# Patient Record
Sex: Female | Born: 1955 | Race: White | Hispanic: No | Marital: Married | Smoking: Never smoker
Health system: Southern US, Community
[De-identification: ages and names within clinical notes are randomized; demographics above are authoritative.]

## PROBLEM LIST (undated history)

## (undated) DIAGNOSIS — E039 Hypothyroidism, unspecified: Secondary | ICD-10-CM

## (undated) DIAGNOSIS — C3490 Malignant neoplasm of unspecified part of unspecified bronchus or lung: Secondary | ICD-10-CM

## (undated) DIAGNOSIS — K219 Gastro-esophageal reflux disease without esophagitis: Secondary | ICD-10-CM

## (undated) HISTORY — DX: Gastro-esophageal reflux disease without esophagitis: K21.9

## (undated) HISTORY — DX: Malignant neoplasm of unspecified part of unspecified bronchus or lung: C34.90

## (undated) HISTORY — DX: Hypothyroidism, unspecified: E03.9

## (undated) HISTORY — PX: LOBECTOMY: SHX5089

---

## 1981-07-26 HISTORY — PX: BREAST LUMPECTOMY: SHX2

## 2010-07-26 HISTORY — PX: OTHER SURGICAL HISTORY: SHX169

## 2014-07-26 HISTORY — PX: CATARACT EXTRACTION: SUR2

## 2015-10-27 LAB — BASIC METABOLIC PANEL
BUN: 12 mg/dL (ref 4–21)
Creatinine: 1.2 mg/dL — AB (ref 0.5–1.1)
Glucose: 88 mg/dL
Potassium: 3.9 mmol/L (ref 3.4–5.3)
Sodium: 141 mmol/L (ref 137–147)

## 2015-10-27 LAB — LIPID PANEL
Cholesterol: 231 mg/dL — AB (ref 0–200)
HDL: 50 mg/dL (ref 35–70)
LDL Cholesterol: 159 mg/dL
Triglycerides: 109 mg/dL (ref 40–160)

## 2015-10-27 LAB — HEPATIC FUNCTION PANEL: ALT: 17 U/L (ref 7–35)

## 2015-10-27 LAB — CBC AND DIFFERENTIAL
HCT: 43 % (ref 36–46)
Hemoglobin: 14.5 g/dL (ref 12.0–16.0)
Platelets: 311 10*3/uL (ref 150–399)
WBC: 8.1 10^3/mL

## 2015-10-28 LAB — HM PAP SMEAR: HM Pap smear: NORMAL

## 2016-01-06 ENCOUNTER — Ambulatory Visit (INDEPENDENT_AMBULATORY_CARE_PROVIDER_SITE_OTHER): Payer: No Typology Code available for payment source | Admitting: Nurse Practitioner

## 2016-01-06 ENCOUNTER — Encounter: Payer: Self-pay | Admitting: Nurse Practitioner

## 2016-01-06 VITALS — BP 118/82 | HR 59 | Temp 98.2°F | Resp 17 | Ht 64.5 in | Wt 178.6 lb

## 2016-01-06 DIAGNOSIS — C3490 Malignant neoplasm of unspecified part of unspecified bronchus or lung: Secondary | ICD-10-CM | POA: Insufficient documentation

## 2016-01-06 DIAGNOSIS — E034 Atrophy of thyroid (acquired): Secondary | ICD-10-CM

## 2016-01-06 DIAGNOSIS — C3432 Malignant neoplasm of lower lobe, left bronchus or lung: Secondary | ICD-10-CM | POA: Diagnosis not present

## 2016-01-06 DIAGNOSIS — K219 Gastro-esophageal reflux disease without esophagitis: Secondary | ICD-10-CM

## 2016-01-06 DIAGNOSIS — E039 Hypothyroidism, unspecified: Secondary | ICD-10-CM | POA: Insufficient documentation

## 2016-01-06 DIAGNOSIS — G4700 Insomnia, unspecified: Secondary | ICD-10-CM | POA: Diagnosis not present

## 2016-01-06 DIAGNOSIS — E038 Other specified hypothyroidism: Secondary | ICD-10-CM

## 2016-01-06 NOTE — Progress Notes (Signed)
Patient ID: Tami Parks, female   DOB: 22-May-1956, 60 y.o.   MRN: [id number]    PCP: [name], NP  Advanced Directive information Does patient have an advance directive?: Yes, Type of Advance Directive: [place];Living will  Allergies  Allergen Reactions  . Sulfa Antibiotics Swelling  . Oxycodone Rash    Chief Complaint  Patient presents with  . Medical Management of Chronic Issues    Establish as new patient.      HPI: Patient is a 60 y.o. female seen in the office today to establish care. Pt and her husband moved from [place] were she previously had routine medical care.  Pt has lung cancer which is in remission. (previously given 6 months to live- this was in 2012, had chemo. Has "dots" in her lungs that no one can explain.not currently having symptoms. Was found after she had pneumonia. Had bronchoscopy and was cancerous, had lobectomy to remove tumor, then had chemo.  Needs to establish with oncologist in town.  Had hyperthyroid and was given radioactive treatment now hypothyroid  last mammogram in 2014- does not wish to have mammogram too frequently to limit exposure to xray Declines colonoscopy- does stool cards (have been negative) Walks frequently and stretching  Heart healthy diet  Last physical and blood work done April   Review of Systems:  Review of Systems  Constitutional: Negative for activity change, appetite change, fatigue and unexpected weight change.  HENT: Negative for congestion and hearing loss.   Eyes: Negative.        Wears corrective lens  Respiratory: Negative for cough and shortness of breath.   Cardiovascular: Negative for chest pain, palpitations and leg swelling.  Gastrointestinal: Negative for abdominal pain, diarrhea and constipation.  Genitourinary: Negative for dysuria and difficulty urinating.  Musculoskeletal: Negative for myalgias and arthralgias.  Skin: Positive for rash (on side of her nose off and on for  15 years). Negative for color change and wound.  Neurological: Negative for dizziness and weakness.  Psychiatric/Behavioral: Positive for sleep disturbance (better on medication). Negative for behavioral problems, confusion and agitation.    Past Medical History  Diagnosis Date  . Hypothyroid   . Lung cancer ([place])   . GERD (gastroesophageal reflux disease)    Past Surgical History  Procedure Laterality Date  . Cesarean section  1982  . Breast lumpectomy Left 1983  . Cesarean section  1984  . Lower left lobe removal  2012    Dr. Avelina Laine. [facility] Natter  . Cataract extraction Right 2016    Dr. Loma Boston  . Lobectomy Left left lower lobe    2012   Social History:   reports that she has never smoked. She does not have any smokeless tobacco history on file. She reports that she does not drink alcohol or use illicit drugs.  Family History  Problem Relation Age of Onset  . Diabetes Father   . High blood pressure Father   . Heart disease Father   . Hypertension Father   . Parkinson's disease Mother   . Asthma Mother   . Hyperthyroidism Sister   . Heart disease Son   . Asthma Son     Medications: Patient's Medications  New Prescriptions   No medications on file  Previous Medications   DIPHENHYDRAMINE (SOMINEX) 25 MG TABLET    Take 25 mg by mouth at bedtime as needed for sleep.   KETOCONAZOLE (NIZORAL) 2 % CREAM    Apply 1 application topically daily as needed for  irritation.   LEVOTHYROXINE (SYNTHROID, LEVOTHROID) 88 MCG TABLET       OMEPRAZOLE (PRILOSEC) 40 MG CAPSULE       VITAMIN C (ASCORBIC ACID) 500 MG TABLET    Take 500 mg by mouth daily as needed.  Modified Medications   No medications on file  Discontinued Medications   No medications on file     Physical Exam:  Filed Vitals:   01/06/16 0909  BP: 118/82  Pulse: 59  Temp: 98.2 F (36.8 C)  TempSrc: Oral  Resp: 17  Height: 5' 4.5" (1.638 m)  Weight: 178 lb 9.6 oz (81.012 kg)  SpO2: 96%   Body mass index is  30.19 kg/(m^2).  Physical Exam  Constitutional: She is oriented to person, place, and time. She appears well-developed and well-nourished. No distress.  HENT:  Head: Normocephalic and atraumatic.  Mouth/Throat: Oropharynx is clear and moist. No oropharyngeal exudate.  Eyes: Conjunctivae are normal. Pupils are equal, round, and reactive to light.  Neck: Normal range of motion. Neck supple.  Cardiovascular: Normal rate, regular rhythm and normal heart sounds.   Pulmonary/Chest: Effort normal and breath sounds normal.  Abdominal: Soft. Bowel sounds are normal.  Musculoskeletal: She exhibits no edema or tenderness.  Neurological: She is alert and oriented to person, place, and time.  Skin: Skin is warm and dry. She is not diaphoretic.  Psychiatric: She has a normal mood and affect.    Labs reviewed: Basic Metabolic Panel:  Recent Labs  10/27/15  NA 141  K 3.9  BUN 12  CREATININE 1.2*   Liver Function Tests:  Recent Labs  10/27/15  ALT 17   No results for input(s): LIPASE, AMYLASE in the last 8760 hours. No results for input(s): AMMONIA in the last 8760 hours. CBC:  Recent Labs  10/27/15  WBC 8.1  HGB 14.5  HCT 43  PLT 311   Lipid Panel:  Recent Labs  10/27/15  CHOL 231*  HDL 50  LDLCALC 159  TRIG 109   TSH: 06/27/15 TSH 2.94 No results for input(s): TSH in the last 8760 hours. A1C: No results found for: HGBA1C   Assessment/Plan 1. Hypothyroidism due to acquired atrophy of thyroid -has cont on synthroid 7mg, tsh stable  2. Gastroesophageal reflux disease without esophagitis -stable on prolosec 40 mg daily  3. Malignant neoplasm of lower lobe of left lung ([place] -s/p lobectomy in remission currently, plans to establish with oncologist in [place] - CBC with Differential/Platelets; Future - Comprehensive metabolic panel; Future  4. 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 Taking sominex with good effect  To follow up in April for physical with  fasting blood work prior to visit -to obtain TSH, CBC, CMP and fasting lipids  Jessica K. EHarle Battiest [facility] [phone] am - 5 pm) [phone](after hours)

## 2016-01-06 NOTE — Patient Instructions (Signed)
Follow up in April 2018 with lab work prior to visit

## 2016-03-18 ENCOUNTER — Ambulatory Visit: Payer: No Typology Code available for payment source

## 2016-03-18 DIAGNOSIS — Z23 Encounter for immunization: Secondary | ICD-10-CM

## 2016-06-08 ENCOUNTER — Other Ambulatory Visit: Payer: Self-pay

## 2016-06-08 ENCOUNTER — Telehealth: Payer: Self-pay | Admitting: *Deleted

## 2016-06-08 DIAGNOSIS — C3490 Malignant neoplasm of unspecified part of unspecified bronchus or lung: Secondary | ICD-10-CM

## 2016-06-08 MED ORDER — LEVOTHYROXINE SODIUM 88 MCG PO TABS: 88.0000 ug | ORAL_TABLET | Freq: Every day | ORAL | 1 refills | Status: DC

## 2016-06-08 NOTE — Telephone Encounter (Signed)
Okay to place referral to oncology

## 2016-06-08 NOTE — Telephone Encounter (Signed)
Received a fax from [facility] that requested a 90 day supply of levothyroxine 88 mcg. Prescription was sent electronically.

## 2016-06-08 NOTE — Telephone Encounter (Signed)
Patient called and Requested a Referral to [facility]. Needs to see Oncology for follow up for Lung Cancer that is in remission. Please Advise.

## 2016-06-09 NOTE — Telephone Encounter (Signed)
Referral placed and patient notified.

## 2016-06-30 ENCOUNTER — Encounter: Payer: Self-pay | Admitting: Internal Medicine

## 2016-06-30 ENCOUNTER — Other Ambulatory Visit (HOSPITAL_BASED_OUTPATIENT_CLINIC_OR_DEPARTMENT_OTHER): Payer: No Typology Code available for payment source

## 2016-06-30 ENCOUNTER — Other Ambulatory Visit: Payer: Self-pay | Admitting: *Deleted

## 2016-06-30 ENCOUNTER — Telehealth: Payer: Self-pay | Admitting: Internal Medicine

## 2016-06-30 ENCOUNTER — Ambulatory Visit (HOSPITAL_BASED_OUTPATIENT_CLINIC_OR_DEPARTMENT_OTHER): Payer: No Typology Code available for payment source | Admitting: Internal Medicine

## 2016-06-30 DIAGNOSIS — Z85118 Personal history of other malignant neoplasm of bronchus and lung: Secondary | ICD-10-CM | POA: Diagnosis not present

## 2016-06-30 DIAGNOSIS — R918 Other nonspecific abnormal finding of lung field: Secondary | ICD-10-CM | POA: Diagnosis not present

## 2016-06-30 DIAGNOSIS — C3410 Malignant neoplasm of upper lobe, unspecified bronchus or lung: Secondary | ICD-10-CM

## 2016-06-30 DIAGNOSIS — C3432 Malignant neoplasm of lower lobe, left bronchus or lung: Secondary | ICD-10-CM | POA: Insufficient documentation

## 2016-06-30 LAB — CBC WITH DIFFERENTIAL/PLATELET
BASO%: 0.6 % (ref 0.0–2.0)
Basophils Absolute: 0 10*3/uL (ref 0.0–0.1)
EOS%: 1.8 % (ref 0.0–7.0)
Eosinophils Absolute: 0.1 10*3/uL (ref 0.0–0.5)
HCT: 39.7 % (ref 34.8–46.6)
HGB: 13.7 g/dL (ref 11.6–15.9)
LYMPH%: 30.9 % (ref 14.0–49.7)
MCH: 29.9 pg (ref 25.1–34.0)
MCHC: 34.5 g/dL (ref 31.5–36.0)
MCV: 86.7 fL (ref 79.5–101.0)
MONO#: 0.4 10*3/uL (ref 0.1–0.9)
MONO%: 6.2 % (ref 0.0–14.0)
NEUT#: 4.1 10*3/uL (ref 1.5–6.5)
NEUT%: 60.5 % (ref 38.4–76.8)
Platelets: 302 10*3/uL (ref 145–400)
RBC: 4.58 10*6/uL (ref 3.70–5.45)
RDW: 13.3 % (ref 11.2–14.5)
WBC: 6.8 10*3/uL (ref 3.9–10.3)
lymph#: 2.1 10*3/uL (ref 0.9–3.3)
nRBC: 0 % (ref 0–0)

## 2016-06-30 LAB — COMPREHENSIVE METABOLIC PANEL
ALT: 14 U/L (ref 0–55)
AST: 18 U/L (ref 5–34)
Albumin: 3.8 g/dL (ref 3.5–5.0)
Alkaline Phosphatase: 98 U/L (ref 40–150)
Anion Gap: 10 mEq/L (ref 3–11)
BUN: 13.4 mg/dL (ref 7.0–26.0)
CO2: 26 mEq/L (ref 22–29)
Calcium: 9.8 mg/dL (ref 8.4–10.4)
Chloride: 106 mEq/L (ref 98–109)
Creatinine: 0.9 mg/dL (ref 0.6–1.1)
EGFR: 66 mL/min/{1.73_m2} — ABNORMAL LOW (ref >90)
Glucose: 103 mg/dl (ref 70–140)
Potassium: 3.6 mEq/L (ref 3.5–5.1)
Sodium: 142 mEq/L (ref 136–145)
Total Bilirubin: 0.39 mg/dL (ref 0.20–1.20)
Total Protein: 7.5 g/dL (ref 6.4–8.3)

## 2016-06-30 MED ORDER — LORAZEPAM 0.5 MG PO TABS: ORAL_TABLET | ORAL | 0 refills | Status: DC

## 2016-06-30 NOTE — Telephone Encounter (Signed)
Appointments scheduled per 12/6 LOS. Patient given AVS report and calendars with future scheduled appointments. Patient aware of scan to be scheduled.

## 2016-06-30 NOTE — Progress Notes (Signed)
[place] Telephone:([phone]   Fax:([phone]  CONSULT NOTE  REFERRING PHYSICIAN: Sherrie Mustache, NP  REASON FOR CONSULTATION:  60 years old white female with history of lung cancer.  HPI Tami Parks is a 60 y.o. female a never smoker with past medical history significant for hypothyroidism status post radioactive iodine, GERD, left breast lumpectomy for benign lesion as well as history of stage II non-small cell lung cancer, adenocarcinoma diagnosed in 2012. The patient mentions that in December 2011 she was treated for pneumonia and follow-up imaging studies in 2012 showed persistent left lower lobe lung capacity. She underwent bronchoscopy at [facility] and it was consistent with adenocarcinoma. The patient underwent left lower lobectomy with lymph node dissection under the care of Dr. Marcelle Overlie at [facility] and the final pathology was consistent with a stage IIB (pT3, N0, MX) with tumor measuring  7.5 cm adenocarcinoma. Her tumor was negative for EGFR and ALK mutations. She received 4 cycles of adjuvant systemic chemotherapy with cisplatin based regimen. Unfortunately I'm not able to find the other agent of chemotherapy that was used with cisplatin from the [facility] records. After completion of her systemic chemotherapy and restaging scan showed development of new tiny nodules in the right upper lobe. These nodules continue to progress on several scans and also she developed nodules in the left lung. Several options were discussed at that time including surveillance versus biopsy versus treatment for atypical infection. The patient was seen by infectious disease physician at [facility] Dr. 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 She change in her oncologist to Dr. Kallie Edward. She was followed by observation and her last CT scan of the chest. Her last CT scan of the chest was performed on 02/07/2015 and it showed multifocal solid and ground  glass masses seen in the right upper and lower lobes that have increased in size in the interim. The conglomerated mass area in the right lower lobe measured approximately 6.9 x 4.2 cm in comparison to 6.1 x 3.5 cm on the previous study. The mass in the right upper lobe measured 4.7 x 9.2 cm compares to 3.9 x 7.7 cm on previous study. In the left lung there were multiple nodules seen predominantly in the basal aspect of the left upper lobe and some of these nodules were new compared to the previous study. The patient left to [facility] to receive her care at [facility]. She was a little bit concerned about her previous care at [facility]. She established care with Mrs. Dewaine Oats for primary care. She was kindly referred to me today for evaluation and recommendation regarding her condition. She has no imaging studies since July 2016. The patient is feeling fine today with no complaints. She denied having any significant weight loss or night sweats. She has no chest pain, shortness breath, cough or hemoptysis. She denied having any nausea or vomiting, no fever or chills. She denied having any headache or visual changes. Family history significant for father with diabetes mellitus, mother with hypothyroidism and parkinsonism. The patient is married and has 2 children. She was accompanied today by her husband Tami Parks. Her children live in [place]. 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 She has no history of smoking, alcohol or drug abuse.  HPI  Past Medical History:  Diagnosis Date  . GERD (gastroesophageal reflux disease)   . Hypothyroid   . Lung cancer [facility])     Past Surgical  History:  Procedure Laterality Date  . BREAST LUMPECTOMY Left 1983  . CATARACT EXTRACTION Right 2016   Dr. Loma Boston  . [place]  . CESAREAN SECTION  1984  . LOBECTOMY Left left lower lobe   2012  . lower left lobe removal  2012   Dr. Avelina Laine. Williemae Natter    Family  History  Problem Relation Age of Onset  . Diabetes Father   . High blood pressure Father   . Heart disease Father   . Hypertension Father   . Parkinson's disease Mother   . Asthma Mother   . Hyperthyroidism Sister   . Heart disease Son   . Asthma Son     Social History Social History  Substance Use Topics  . Smoking status: Never Smoker  . Smokeless tobacco: Never Used  . Alcohol use No    Allergies  Allergen Reactions  . Sulfa Antibiotics Swelling  . Oxycodone Rash    Current Outpatient Prescriptions  Medication Sig Dispense Refill  . diphenhydrAMINE (SOMINEX) 25 MG tablet Take 25 mg by mouth at bedtime as needed for sleep.    Marland Kitchen DIPHENHYDRAMINE HCL PO Take by mouth.    Marland Kitchen ketoconazole (NIZORAL) 2 % cream Apply 1 application topically daily as needed for irritation.    Marland Kitchen levothyroxine (SYNTHROID, LEVOTHROID) 88 MCG tablet Take 1 tablet (88 mcg total) by mouth daily before breakfast. 98 tablet 1  . omeprazole (PRILOSEC) 40 MG capsule     . vitamin C (ASCORBIC ACID) 500 MG tablet Take 500 mg by mouth daily as needed.    Marland Kitchen LORazepam (ATIVAN) 0.5 MG tablet Take 1 or 2 tablets before the CT scan. 4 tablet 0   No current facility-administered medications for this visit.    Blood pressure 120/77, pulse (!) 57, temperature 98.2 F (36.8 C), temperature source Oral, resp. rate 18, height 5' 4.5" (1.638 m), weight 176 lb 4.8 oz (80 kg), SpO2 99 %. Review of Systems  Constitutional: negative Eyes: negative for icterus, irritation and redness Ears, nose, mouth, throat, and face: negative for epistaxis, hoarseness, sore mouth and sore throat Respiratory: negative Cardiovascular: negative for dyspnea, fatigue, orthopnea and palpitations Gastrointestinal: negative for constipation, diarrhea, nausea and vomiting Genitourinary:negative for dysuria, frequency and hematuria Integument/breast: negative for dryness and rash Hematologic/lymphatic: negative for bleeding and easy  bruising Musculoskeletal:negative for arthralgias, back pain, bone pain and muscle weakness Neurological: negative for dizziness, gait problems, headaches, tremors and weakness Behavioral/Psych: negative Endocrine: negative Allergic/Immunologic: negative  Physical Exam  GGE:ZMOQH, healthy, no distress, well nourished, well developed and anxious SKIN: skin color, texture, turgor are normal, no rashes or significant lesions HEAD: Normocephalic, No masses, lesions, tenderness or abnormalities EYES: normal, PERRLA, Conjunctiva are pink and non-injected EARS: External ears normal, Canals clear OROPHARYNX:no exudate, no erythema and lips, buccal mucosa, and tongue normal  NECK: supple, no adenopathy, no JVD LYMPH:  no palpable lymphadenopathy, no hepatosplenomegaly BREAST:not examined LUNGS: clear to auscultation , and palpation HEART: regular rate & rhythm, no murmurs and no gallops ABDOMEN:abdomen soft, non-tender, normal bowel sounds and no masses or organomegaly BACK: Back symmetric, no curvature., No CVA tenderness EXTREMITIES:no joint deformities, effusion, or inflammation, no edema, no skin discoloration  NEURO: alert & oriented x 3 with fluent speech, no focal motor/sensory deficits  PERFORMANCE STATUS: ECOG 0  LABORATORY DATA: Lab Results  Component Value Date   WBC 6.8 06/30/2016   HGB 13.7 06/30/2016   HCT 39.7 06/30/2016   MCV 86.7 06/30/2016   PLT  302 06/30/2016      Chemistry      Component Value Date/Time   NA 142 06/30/2016 1300   K 3.6 06/30/2016 1300   CO2 26 06/30/2016 1300   BUN 13.4 06/30/2016 1300   CREATININE 0.9 06/30/2016 1300   GLU 88 10/27/2015      Component Value Date/Time   CALCIUM 9.8 06/30/2016 1300   ALKPHOS 98 06/30/2016 1300   AST 18 06/30/2016 1300   ALT 14 06/30/2016 1300   BILITOT 0.39 06/30/2016 1300       RADIOGRAPHIC STUDIES: No results found.  ASSESSMENT:This is a very pleasant 60 years old white female never smoker with  history of stage IIB (T3, N0, M0) non-small cell lung cancer, adenocarcinoma with negative EGFR or ALK mutations. She status post left lower lobectomy with lymph node dissection in May 2012 under the care of Dr. Gwenlyn Found at [facility]. This was followed by adjuvant systemic chemotherapy with 4 cycles of cisplatin-based regimen. Over the last few years since October 2012 the patient has progressive solid and groundglass opacities mainly in the right upper and lower lobe but also in the left upper lobe highly concerning for disease recurrence. She has been observation or that time with no additional treatment. She did not have any biopsy for confirmation of disease recurrence. She was treated in the past for questionable atypical infection. She is feeling fine today. Her last imaging studies were performed in July 2016.  PLAN: I had a lengthy discussion with the patient and her husband today about her current condition and further investigation regarding her condition. I strongly recommended for the patient to consider repeating CT scan of the chest for restaging of her disease. She is anxious and she mentions that she may not be able to have the scan done because of her anxiety and claustrophobia. She refused the scan to be performed with contrast and agreed to CT scan of the chest without contrast. She is also not interested in having a PET scan done. I will arrange for the patient to have CT scan of the chest without contrast next week. I will see her back for follow-up visit in 2 weeks for reevaluation and discussion of her scan results and further recommendation regarding her condition. I'm not sure if the patient would be interested in any future treatment if needed including chemotherapy or immunotherapy. This will be discussed with her more details based on the scan results. For the anxiety and claustrophobia, I gave the patient prescription for Ativan 0.5 mg to be used 1-2 tablets before her  scan. She was advised to call immediately if she has any concerning symptoms in the interval.  The patient voices understanding of current disease status and treatment options and is in agreement with the current care plan.  All questions were answered. The patient knows to call the clinic with any problems, questions or concerns. We can certainly see the patient much sooner if necessary.  Thank you so much for allowing me to participate in the care of Tami Parks. I will continue to follow up the patient with you and assist in her care.  I spent 40 minutes counseling the patient face to face. The total time spent in the appointment was 60 minutes.  Disclaimer: This note was dictated with voice recognition software. Similar sounding words can inadvertently be transcribed and may not be corrected upon review.   MOHAMED,MOHAMED K. June 30, 2016, 2:39 PM

## 2016-07-06 ENCOUNTER — Other Ambulatory Visit: Payer: Self-pay | Admitting: Medical Oncology

## 2016-07-06 DIAGNOSIS — C3432 Malignant neoplasm of lower lobe, left bronchus or lung: Secondary | ICD-10-CM

## 2016-07-07 ENCOUNTER — Other Ambulatory Visit (HOSPITAL_BASED_OUTPATIENT_CLINIC_OR_DEPARTMENT_OTHER): Payer: No Typology Code available for payment source

## 2016-07-07 ENCOUNTER — Ambulatory Visit (HOSPITAL_COMMUNITY)
Admission: RE | Admit: 2016-07-07 | Discharge: 2016-07-07 | Disposition: A | Discharge status: Home / Self Care | Payer: No Typology Code available for payment source | Source: Ambulatory Visit | Attending: Internal Medicine | Admitting: Internal Medicine

## 2016-07-07 DIAGNOSIS — R918 Other nonspecific abnormal finding of lung field: Secondary | ICD-10-CM

## 2016-07-07 DIAGNOSIS — Z902 Acquired absence of lung [part of]: Secondary | ICD-10-CM | POA: Insufficient documentation

## 2016-07-07 DIAGNOSIS — C3432 Malignant neoplasm of lower lobe, left bronchus or lung: Secondary | ICD-10-CM

## 2016-07-07 DIAGNOSIS — Z85118 Personal history of other malignant neoplasm of bronchus and lung: Secondary | ICD-10-CM | POA: Diagnosis not present

## 2016-07-07 LAB — COMPREHENSIVE METABOLIC PANEL
ALT: 13 U/L (ref 0–55)
AST: 16 U/L (ref 5–34)
Albumin: 3.8 g/dL (ref 3.5–5.0)
Alkaline Phosphatase: 93 U/L (ref 40–150)
Anion Gap: 9 mEq/L (ref 3–11)
BUN: 18.9 mg/dL (ref 7.0–26.0)
CO2: 25 mEq/L (ref 22–29)
Calcium: 9.9 mg/dL (ref 8.4–10.4)
Chloride: 107 mEq/L (ref 98–109)
Creatinine: 1 mg/dL (ref 0.6–1.1)
EGFR: 57 mL/min/{1.73_m2} — ABNORMAL LOW (ref >90)
Glucose: 71 mg/dl (ref 70–140)
Potassium: 3.8 mEq/L (ref 3.5–5.1)
Sodium: 141 mEq/L (ref 136–145)
Total Bilirubin: 0.47 mg/dL (ref 0.20–1.20)
Total Protein: 7.7 g/dL (ref 6.4–8.3)

## 2016-07-07 LAB — CBC WITH DIFFERENTIAL/PLATELET
BASO%: 1.1 % (ref 0.0–2.0)
Basophils Absolute: 0.1 10*3/uL (ref 0.0–0.1)
EOS%: 1.9 % (ref 0.0–7.0)
Eosinophils Absolute: 0.1 10*3/uL (ref 0.0–0.5)
HCT: 42.5 % (ref 34.8–46.6)
HGB: 14 g/dL (ref 11.6–15.9)
LYMPH%: 28.2 % (ref 14.0–49.7)
MCH: 29.2 pg (ref 25.1–34.0)
MCHC: 33 g/dL (ref 31.5–36.0)
MCV: 88.3 fL (ref 79.5–101.0)
MONO#: 0.5 10*3/uL (ref 0.1–0.9)
MONO%: 6.8 % (ref 0.0–14.0)
NEUT#: 4.8 10*3/uL (ref 1.5–6.5)
NEUT%: 62 % (ref 38.4–76.8)
Platelets: 311 10*3/uL (ref 145–400)
RBC: 4.81 10*6/uL (ref 3.70–5.45)
RDW: 13.6 % (ref 11.2–14.5)
WBC: 7.7 10*3/uL (ref 3.9–10.3)
lymph#: 2.2 10*3/uL (ref 0.9–3.3)

## 2016-07-14 ENCOUNTER — Telehealth: Payer: Self-pay | Admitting: *Deleted

## 2016-07-14 NOTE — Telephone Encounter (Signed)
Pt called to confirm 12/22 appointment.

## 2016-07-16 ENCOUNTER — Ambulatory Visit (HOSPITAL_BASED_OUTPATIENT_CLINIC_OR_DEPARTMENT_OTHER): Payer: No Typology Code available for payment source | Admitting: Internal Medicine

## 2016-07-16 ENCOUNTER — Encounter: Payer: Self-pay | Admitting: Internal Medicine

## 2016-07-16 ENCOUNTER — Telehealth: Payer: Self-pay | Admitting: Internal Medicine

## 2016-07-16 VITALS — BP 124/61 | HR 61 | Temp 98.4°F | Resp 18 | Ht 64.5 in | Wt 176.1 lb

## 2016-07-16 DIAGNOSIS — R918 Other nonspecific abnormal finding of lung field: Secondary | ICD-10-CM

## 2016-07-16 DIAGNOSIS — Z85118 Personal history of other malignant neoplasm of bronchus and lung: Secondary | ICD-10-CM | POA: Diagnosis not present

## 2016-07-16 DIAGNOSIS — C3432 Malignant neoplasm of lower lobe, left bronchus or lung: Secondary | ICD-10-CM

## 2016-07-16 NOTE — Progress Notes (Signed)
[place] Telephone:([phone]   Fax:(336) [phone]  OFFICE PROGRESS NOTE  Tami Chandler, NP [place] [address]  DIAGNOSIS: Recurrent non-small cell lung cancer, adenocarcinoma with negative EGFR and ALK mutations diagnosed initially as a stage IIB (T3, N0, Mx) in December 2012  PRIOR THERAPY:  1) Status post left lower lobectomy with lymph node dissection under the care of Dr. Elta Guadeloupe very at [facility]. 2) status post 4 cycles of adjuvant systemic chemotherapy with cisplatin and Alimta.  CURRENT THERAPY: Observation.  INTERVAL HISTORY: Tami Parks 60 y.o. female returns to the clinic today for follow-up visit accompanied by her husband. The patient continues to feel fine today with no specific complaints. She is very active and has no limitations. Over the last few years she has evidence of disease recurrence with enlargement of right lung masses. Her last imaging studies were performed in July 2016. She had repeat CT scan of the chest performed recently and she is here for evaluation and discussion of her scan results and treatment options. She denied having any symptoms today. She has no fever or chills. She denied having any weight loss or night sweats. She denied having any nausea, vomiting, diarrhea or constipation. She has no chest pain, shortness of breath, cough or hemoptysis. She denied having any headache or visual changes.  MEDICAL HISTORY: Past Medical History:  Diagnosis Date  . GERD (gastroesophageal reflux disease)   . Hypothyroid   . Lung cancer ([place])     ALLERGIES:  is allergic to sulfa antibiotics and oxycodone.  MEDICATIONS:  Current Outpatient Prescriptions  Medication Sig Dispense Refill  . diphenhydrAMINE (SOMINEX) 25 MG tablet Take 25 mg by mouth at bedtime as needed for sleep.    Marland Kitchen ketoconazole (NIZORAL) 2 % cream Apply 1 application topically daily as needed for irritation.    Marland Kitchen levothyroxine  (SYNTHROID, LEVOTHROID) 88 MCG tablet Take 1 tablet (88 mcg total) by mouth daily before breakfast. 98 tablet 1  . LORazepam (ATIVAN) 0.5 MG tablet Take 1 or 2 tablets before the CT scan. 4 tablet 0  . omeprazole (PRILOSEC) 40 MG capsule     . vitamin C (ASCORBIC ACID) 500 MG tablet Take 500 mg by mouth daily as needed.     No current facility-administered medications for this visit.     SURGICAL HISTORY:  Past Surgical History:  Procedure Laterality Date  . BREAST LUMPECTOMY Left 1983  . CATARACT EXTRACTION Right 2016   Dr. Loma Boston  . [place]  . CESAREAN SECTION  1984  . LOBECTOMY Left left lower lobe   2012  . lower left lobe removal  2012   Dr. Avelina Laine. Williemae Natter    REVIEW OF SYSTEMS:  Constitutional: negative Eyes: negative Ears, nose, mouth, throat, and face: negative Respiratory: negative Cardiovascular: negative Gastrointestinal: negative Genitourinary:negative Integument/breast: negative Hematologic/lymphatic: negative Musculoskeletal:negative Neurological: negative Behavioral/Psych: negative Endocrine: negative Allergic/Immunologic: negative   PHYSICAL EXAMINATION: General appearance: alert, cooperative and no distress Head: Normocephalic, without obvious abnormality, atraumatic Neck: no adenopathy, no JVD, supple, symmetrical, trachea midline and thyroid not enlarged, symmetric, no tenderness/mass/nodules Lymph nodes: Cervical, supraclavicular, and axillary nodes normal. Resp: clear to auscultation bilaterally Back: symmetric, no curvature. ROM normal. No CVA tenderness. Cardio: regular rate and rhythm, S1, S2 normal, no murmur, click, rub or gallop GI: soft, non-tender; bowel sounds normal; no masses,  no organomegaly Extremities: extremities normal, atraumatic, no cyanosis or edema Neurologic: Alert and oriented X 3,  normal strength and tone. Normal symmetric reflexes. Normal coordination and gait  ECOG PERFORMANCE STATUS: 0 -  Asymptomatic  Blood pressure 124/61, pulse 61, temperature 98.4 F (36.9 C), temperature source Oral, resp. rate 18, height 5' 4.5" (1.638 m), weight 176 lb 1.6 oz (79.9 kg), SpO2 98 %.  LABORATORY DATA: Lab Results  Component Value Date   WBC 7.7 07/07/2016   HGB 14.0 07/07/2016   HCT 42.5 07/07/2016   MCV 88.3 07/07/2016   PLT 311 07/07/2016      Chemistry      Component Value Date/Time   NA 141 07/07/2016 0745   K 3.8 07/07/2016 0745   CO2 25 07/07/2016 0745   BUN 18.9 07/07/2016 0745   CREATININE 1.0 07/07/2016 0745   GLU 88 10/27/2015      Component Value Date/Time   CALCIUM 9.9 07/07/2016 0745   ALKPHOS 93 07/07/2016 0745   AST 16 07/07/2016 0745   ALT 13 07/07/2016 0745   BILITOT 0.47 07/07/2016 0745       RADIOGRAPHIC STUDIES: Ct Chest Wo Contrast  Result Date: 07/09/2016 CLINICAL DATA:  Restaging left lung cancer diagnosed 2012, increasing lung nodules EXAM: CT CHEST WITHOUT CONTRAST TECHNIQUE: Multidetector CT imaging of the chest was performed following the standard protocol without IV contrast. COMPARISON:  [facility] CT chest dated 05/10/2015 FINDINGS: Cardiovascular: Heart is normal in size.  No pericardial effusion. Very mild atherosclerotic calcifications of the aortic arch. Mediastinum/Nodes: No suspicious mediastinal lymphadenopathy. Visualized thyroid is unremarkable. Lungs/Pleura:  Status post left lower lobectomy. Ground-glass/coalescent nodule opacity in the right upper lobe measuring at least 8.1 x 3.8 cm (series 5/image 57), previously 4.7 x 4.1 cm. Ground-glass/ coalescent nodular opacity in the right lower lobe measuring 5.9 x 4.3 cm (series 5/ image 89), previously a less solid opacity measuring 5.8 x 3 4 cm. Multiple discrete nodules in the left upper lobe measuring up to 13 mm posteriorly (series 5/ image 106), previously 7 mm. No pleural effusion or pneumothorax. Upper Abdomen: Visualized upper abdomen is unremarkable.  Musculoskeletal: Degenerative changes of the visualized thoracolumbar spine. IMPRESSION: Status post left lower lobectomy. Multifocal nodules/masses bilaterally, measuring up to 8.1 cm in the right upper lobe, compatible with progressive metastatic disease. Electronically Signed   By: Julian Hy M.D.   On: 07/09/2016 14:13    ASSESSMENT AND PLAN: This is a very pleasant 60 years old never smoker white female with recurrent non-small cell lung cancer that was initially diagnosed as stage IIB non-small cell lung cancer, adenocarcinoma with negative EGFR and talc mutations. She is status post left upper lobectomy with lymph node dissection. The patient had repeat CT scan of the chest performed recently. I personally and independently reviewed the scan and discuss the results and showed the images to the patient and her husband.  I discussed with the patient several options for management of her condition including proceeding with CT guided core biopsy of the right upper lobe lung mass for confirmation of tissue diagnosis and molecular studies. The patient is currently asymptomatic and she is reluctant about proceeding with any intervention or treatment at this point. I strongly advised the patient to consider the biopsy but I will also arrange for her to come back for follow-up visit in 6 months with repeat CT scan of the chest without contrast for restaging of her disease If the patient changes her mind regarding the biopsy, she will contact my office for arrangement of this procedure and follow-up visit. She was advised  to call immediately if she has any concerning symptoms in the interval. The patient voices understanding of current disease status and treatment options and is in agreement with the current care plan.  All questions were answered. The patient knows to call the clinic with any problems, questions or concerns. We can certainly see the patient much sooner if necessary.  I spent 15  minutes counseling the patient face to face. The total time spent in the appointment was 25 minutes.  Disclaimer: This note was dictated with voice recognition software. Similar sounding words can inadvertently be transcribed and may not be corrected upon review.

## 2016-07-16 NOTE — Telephone Encounter (Signed)
Appointments scheduled per 12/22 LOS. Patient given AVS report and calendars with future scheduled appointments. °

## 2016-11-04 ENCOUNTER — Encounter: Payer: No Typology Code available for payment source | Admitting: Nurse Practitioner

## 2017-01-13 ENCOUNTER — Ambulatory Visit (HOSPITAL_COMMUNITY)
Admission: RE | Admit: 2017-01-13 | Discharge: 2017-01-13 | Disposition: A | Discharge status: Home / Self Care | Payer: No Typology Code available for payment source | Source: Ambulatory Visit | Attending: Internal Medicine | Admitting: Internal Medicine

## 2017-01-13 ENCOUNTER — Other Ambulatory Visit (HOSPITAL_BASED_OUTPATIENT_CLINIC_OR_DEPARTMENT_OTHER): Payer: No Typology Code available for payment source

## 2017-01-13 DIAGNOSIS — C3432 Malignant neoplasm of lower lobe, left bronchus or lung: Secondary | ICD-10-CM | POA: Diagnosis present

## 2017-01-13 DIAGNOSIS — Z902 Acquired absence of lung [part of]: Secondary | ICD-10-CM | POA: Insufficient documentation

## 2017-01-13 DIAGNOSIS — Z85118 Personal history of other malignant neoplasm of bronchus and lung: Secondary | ICD-10-CM | POA: Diagnosis not present

## 2017-01-13 DIAGNOSIS — R918 Other nonspecific abnormal finding of lung field: Secondary | ICD-10-CM | POA: Diagnosis not present

## 2017-01-13 DIAGNOSIS — K449 Diaphragmatic hernia without obstruction or gangrene: Secondary | ICD-10-CM | POA: Insufficient documentation

## 2017-01-13 LAB — CBC WITH DIFFERENTIAL/PLATELET
BASO%: 1 % (ref 0.0–2.0)
Basophils Absolute: 0.1 10*3/uL (ref 0.0–0.1)
EOS%: 2.7 % (ref 0.0–7.0)
Eosinophils Absolute: 0.2 10*3/uL (ref 0.0–0.5)
HCT: 43.1 % (ref 34.8–46.6)
HGB: 14.7 g/dL (ref 11.6–15.9)
LYMPH%: 27.1 % (ref 14.0–49.7)
MCH: 30.2 pg (ref 25.1–34.0)
MCHC: 34.2 g/dL (ref 31.5–36.0)
MCV: 88.5 fL (ref 79.5–101.0)
MONO#: 0.4 10*3/uL (ref 0.1–0.9)
MONO%: 6.8 % (ref 0.0–14.0)
NEUT#: 4 10*3/uL (ref 1.5–6.5)
NEUT%: 62.4 % (ref 38.4–76.8)
Platelets: 305 10*3/uL (ref 145–400)
RBC: 4.87 10*6/uL (ref 3.70–5.45)
RDW: 14 % (ref 11.2–14.5)
WBC: 6.5 10*3/uL (ref 3.9–10.3)
lymph#: 1.8 10*3/uL (ref 0.9–3.3)

## 2017-01-13 LAB — COMPREHENSIVE METABOLIC PANEL
ALT: 17 U/L (ref 0–55)
AST: 20 U/L (ref 5–34)
Albumin: 4 g/dL (ref 3.5–5.0)
Alkaline Phosphatase: 88 U/L (ref 40–150)
Anion Gap: 8 mEq/L (ref 3–11)
BUN: 16.2 mg/dL (ref 7.0–26.0)
CO2: 27 mEq/L (ref 22–29)
Calcium: 10.2 mg/dL (ref 8.4–10.4)
Chloride: 107 mEq/L (ref 98–109)
Creatinine: 1 mg/dL (ref 0.6–1.1)
EGFR: 59 mL/min/{1.73_m2} — ABNORMAL LOW (ref >90)
Glucose: 69 mg/dl — ABNORMAL LOW (ref 70–140)
Potassium: 4.1 mEq/L (ref 3.5–5.1)
Sodium: 142 mEq/L (ref 136–145)
Total Bilirubin: 0.49 mg/dL (ref 0.20–1.20)
Total Protein: 7.4 g/dL (ref 6.4–8.3)

## 2017-01-17 ENCOUNTER — Ambulatory Visit (HOSPITAL_BASED_OUTPATIENT_CLINIC_OR_DEPARTMENT_OTHER): Payer: No Typology Code available for payment source | Admitting: Internal Medicine

## 2017-01-17 ENCOUNTER — Telehealth: Payer: Self-pay | Admitting: Internal Medicine

## 2017-01-17 ENCOUNTER — Encounter: Payer: Self-pay | Admitting: Internal Medicine

## 2017-01-17 VITALS — BP 116/72 | HR 63 | Temp 98.6°F | Resp 16 | Ht 64.5 in | Wt 168.9 lb

## 2017-01-17 DIAGNOSIS — C3432 Malignant neoplasm of lower lobe, left bronchus or lung: Secondary | ICD-10-CM

## 2017-01-17 DIAGNOSIS — Z85118 Personal history of other malignant neoplasm of bronchus and lung: Secondary | ICD-10-CM | POA: Diagnosis not present

## 2017-01-17 NOTE — Progress Notes (Signed)
[place] [phone]   Fax:(336) [phone]  OFFICE PROGRESS NOTE  [address], Lennette Bihari, [place] [address]  DIAGNOSIS: Recurrent non-small cell lung cancer, adenocarcinoma with negative EGFR and ALK mutations diagnosed initially as a stage IIB (T3, N0, Mx) in December 2012  PRIOR THERAPY:  1) Status post left lower lobectomy with lymph node dissection under the care of Dr. Elta Guadeloupe very at [facility]. 2) status post 4 cycles of adjuvant systemic chemotherapy with cisplatin and Alimta.  CURRENT THERAPY: Observation.  INTERVAL HISTORY: Tami Parks 61 y.o. female returns to the clinic today for follow-up visit accompanied by her husband. The patient is feeling fine today with no specific complaints. She denied having any chest pain, shortness of breath, cough or hemoptysis. She denied having any fever or chills. She has no nausea, vomiting, diarrhea or constipation. She denied having any significant weight loss or night sweats. Today for evaluation with repeat CT scan of the chest.  MEDICAL HISTORY: Past Medical History:  Diagnosis Date  . GERD (gastroesophageal reflux disease)   . Hypothyroid   . Lung cancer ([place])     ALLERGIES:  is allergic to oxycodone and sulfa antibiotics.  MEDICATIONS:  Current Outpatient Prescriptions  Medication Sig Dispense Refill  . diphenhydrAMINE (SOMINEX) 25 MG tablet Take 25 mg by mouth at bedtime as needed for sleep.    Marland Kitchen ketoconazole (NIZORAL) 2 % cream Apply 1 application topically daily as needed for irritation.    Marland Kitchen levothyroxine (SYNTHROID, LEVOTHROID) 88 MCG tablet Take 1 tablet (88 mcg total) by mouth daily before breakfast. 98 tablet 1  . LORazepam (ATIVAN) 0.5 MG tablet Take 1 or 2 tablets before the CT scan. 4 tablet 0  . omeprazole (PRILOSEC) 40 MG capsule     . vitamin C (ASCORBIC ACID) 500 MG tablet Take 500 mg by mouth daily as needed.     No current facility-administered medications  for this visit.     SURGICAL HISTORY:  Past Surgical History:  Procedure Laterality Date  . BREAST LUMPECTOMY Left 1983  . CATARACT EXTRACTION Right 2016   Dr. Loma Boston  . [place]  . CESAREAN SECTION  1984  . LOBECTOMY Left left lower lobe   2012  . lower left lobe removal  2012   Dr. Avelina Laine. Williemae Natter    REVIEW OF SYSTEMS:  A comprehensive review of systems was negative.   PHYSICAL EXAMINATION: General appearance: alert, cooperative and no distress Head: Normocephalic, without obvious abnormality, atraumatic Neck: no adenopathy, no JVD, supple, symmetrical, trachea midline and thyroid not enlarged, symmetric, no tenderness/mass/nodules Lymph nodes: Cervical, supraclavicular, and axillary nodes normal. Resp: clear to auscultation bilaterally Back: symmetric, no curvature. ROM normal. No CVA tenderness. Cardio: regular rate and rhythm, S1, S2 normal, no murmur, click, rub or gallop GI: soft, non-tender; bowel sounds normal; no masses,  no organomegaly Extremities: extremities normal, atraumatic, no cyanosis or edema  ECOG PERFORMANCE STATUS: 0 - Asymptomatic  Blood pressure 116/72, pulse 63, temperature 98.6 F (37 C), temperature source Oral, resp. rate 16, height 5' 4.5" (1.638 m), weight 168 lb 14.4 oz (76.6 kg), SpO2 100 %.  LABORATORY DATA: Lab Results  Component Value Date   WBC 6.5 01/13/2017   HGB 14.7 01/13/2017   HCT 43.1 01/13/2017   MCV 88.5 01/13/2017   PLT 305 01/13/2017      Chemistry      Component Value Date/Time   NA 142 01/13/2017 0802  K 4.1 01/13/2017 0802   CO2 27 01/13/2017 0802   BUN 16.2 01/13/2017 0802   CREATININE 1.0 01/13/2017 0802   GLU 88 10/27/2015      Component Value Date/Time   CALCIUM 10.2 01/13/2017 0802   ALKPHOS 88 01/13/2017 0802   AST 20 01/13/2017 0802   ALT 17 01/13/2017 0802   BILITOT 0.49 01/13/2017 0802       RADIOGRAPHIC STUDIES: Ct Chest Wo Contrast  Result Date: 01/13/2017 CLINICAL DATA:   Lung cancer followup. Status post left lower lobectomy EXAM: CT CHEST WITHOUT CONTRAST TECHNIQUE: Multidetector CT imaging of the chest was performed following the standard protocol without IV contrast. COMPARISON:  07/07/2016. FINDINGS: Cardiovascular: The heart size appears within normal limits. No pericardial effusion. Mediastinum/Nodes: The trachea appears patent and is midline. Small hiatal hernia noted. No mediastinal or hilar adenopathy. No axillary or supraclavicular adenopathy. Lungs/Pleura: No pleural effusion identified. Multifocal pulmonary lesions are again noted. The index ground-glass an coal less and nodular opacity in the right upper lobe measures 7.7 x 4.8 cm, image 55 of series 5. Previously 8.1 x 3.8 cm. The ground-glass/coal less and nodular opacity in the right lower lobe measures 5.4 x 3.8 cm, image 87 of series 5. Previously 5.9 x 4.4 cm. Multiple small nodules are identified throughout both lungs. The index nodule in the posteromedial left upper lobe measures 1.2 cm, image 109 of series 5. Previously 1.3 cm. Index nodule in the right lower lobe measures 1.5 cm, image 58 of series 5. Previously 1.2 cm. Index lesion within the right middle lobe measures 1.5 by 1.3 cm, image 78 of series 5. Previously 1.1 x 1.2 cm. Upper Abdomen: No acute abnormality. Musculoskeletal: No chest wall mass or suspicious bone lesions identified. IMPRESSION: 1. Overall, allowing for subtle differences and morphology of the large dominant cold less of lesions there has been no significant change in the overall tumor burden within both lungs secondary to metastatic adenocarcinoma of the left lower lobe. Electronically Signed   By: Kerby Moors M.D.   On: 01/13/2017 11:26    ASSESSMENT AND PLAN:  This is a very pleasant 61 years old never smoker white female with recurrent non-small cell lung cancer that was initially diagnosed as stage IIB adenocarcinoma with negative EGFR and ALK mutations. She is status post  left upper lobectomy with lymph node dissection and has been observation for several years. The patient had several opacities in her lung concerning for disease recurrence but she is not interested in any intervention at this point. Her recent CT scan of the chest showed stable disease. I discussed the scan results with the patient and her husband and recommended for her to continue on observation for now with repeat CT scan of the chest in 6 months. She was advised to call immediately if she has any concerning symptoms in the interval. The patient voices understanding of current disease status and treatment options and is in agreement with the current care plan. All questions were answered. The patient knows to call the clinic with any problems, questions or concerns. We can certainly see the patient much sooner if necessary. I spent 10 minutes counseling the patient face to face. The total time spent in the appointment was 15 minutes.  Disclaimer: This note was dictated with voice recognition software. Similar sounding words can inadvertently be transcribed and may not be corrected upon review.

## 2017-01-17 NOTE — Telephone Encounter (Signed)
Appointments scheduled per 01/17/17 los. Patient was given a copy of the AVS report and appointment schedule per 01/17/17 los.

## 2017-04-04 ENCOUNTER — Other Ambulatory Visit: Payer: Self-pay | Admitting: Internal Medicine

## 2017-04-04 DIAGNOSIS — C3432 Malignant neoplasm of lower lobe, left bronchus or lung: Secondary | ICD-10-CM

## 2017-07-11 ENCOUNTER — Encounter (HOSPITAL_COMMUNITY): Payer: Self-pay

## 2017-07-11 ENCOUNTER — Ambulatory Visit (HOSPITAL_COMMUNITY): Payer: No Typology Code available for payment source

## 2017-07-11 ENCOUNTER — Ambulatory Visit (HOSPITAL_COMMUNITY)
Admission: RE | Admit: 2017-07-11 | Discharge: 2017-07-11 | Disposition: A | Discharge status: Home / Self Care | Payer: No Typology Code available for payment source | Source: Ambulatory Visit | Attending: Internal Medicine | Admitting: Internal Medicine

## 2017-07-11 ENCOUNTER — Other Ambulatory Visit (HOSPITAL_BASED_OUTPATIENT_CLINIC_OR_DEPARTMENT_OTHER): Payer: No Typology Code available for payment source

## 2017-07-11 DIAGNOSIS — C3432 Malignant neoplasm of lower lobe, left bronchus or lung: Secondary | ICD-10-CM

## 2017-07-11 DIAGNOSIS — Z85118 Personal history of other malignant neoplasm of bronchus and lung: Secondary | ICD-10-CM

## 2017-07-11 DIAGNOSIS — I700 Atherosclerosis of aorta: Secondary | ICD-10-CM | POA: Diagnosis not present

## 2017-07-11 DIAGNOSIS — K449 Diaphragmatic hernia without obstruction or gangrene: Secondary | ICD-10-CM | POA: Diagnosis not present

## 2017-07-11 LAB — CBC WITH DIFFERENTIAL/PLATELET
BASO%: 1.4 % (ref 0.0–2.0)
Basophils Absolute: 0.1 10*3/uL (ref 0.0–0.1)
EOS%: 2.7 % (ref 0.0–7.0)
Eosinophils Absolute: 0.2 10*3/uL (ref 0.0–0.5)
HCT: 43.6 % (ref 34.8–46.6)
HGB: 14.6 g/dL (ref 11.6–15.9)
LYMPH%: 26.5 % (ref 14.0–49.7)
MCH: 29.7 pg (ref 25.1–34.0)
MCHC: 33.6 g/dL (ref 31.5–36.0)
MCV: 88.3 fL (ref 79.5–101.0)
MONO#: 0.4 10*3/uL (ref 0.1–0.9)
MONO%: 6.2 % (ref 0.0–14.0)
NEUT#: 3.9 10*3/uL (ref 1.5–6.5)
NEUT%: 63.2 % (ref 38.4–76.8)
Platelets: 309 10*3/uL (ref 145–400)
RBC: 4.94 10*6/uL (ref 3.70–5.45)
RDW: 13.4 % (ref 11.2–14.5)
WBC: 6.1 10*3/uL (ref 3.9–10.3)
lymph#: 1.6 10*3/uL (ref 0.9–3.3)

## 2017-07-11 LAB — COMPREHENSIVE METABOLIC PANEL
ALT: 15 U/L (ref 0–55)
AST: 19 U/L (ref 5–34)
Albumin: 3.9 g/dL (ref 3.5–5.0)
Alkaline Phosphatase: 103 U/L (ref 40–150)
Anion Gap: 10 mEq/L (ref 3–11)
BUN: 15.5 mg/dL (ref 7.0–26.0)
CO2: 25 mEq/L (ref 22–29)
Calcium: 9.7 mg/dL (ref 8.4–10.4)
Chloride: 108 mEq/L (ref 98–109)
Creatinine: 1 mg/dL (ref 0.6–1.1)
EGFR: >60 mL/min/{1.73_m2} (ref >60)
Glucose: 59 mg/dl — ABNORMAL LOW (ref 70–140)
Potassium: 3.9 mEq/L (ref 3.5–5.1)
Sodium: 143 mEq/L (ref 136–145)
Total Bilirubin: 0.32 mg/dL (ref 0.20–1.20)
Total Protein: 7.4 g/dL (ref 6.4–8.3)

## 2017-07-13 ENCOUNTER — Telehealth: Payer: Self-pay | Admitting: Internal Medicine

## 2017-07-13 ENCOUNTER — Ambulatory Visit (HOSPITAL_BASED_OUTPATIENT_CLINIC_OR_DEPARTMENT_OTHER): Payer: No Typology Code available for payment source | Admitting: Internal Medicine

## 2017-07-13 ENCOUNTER — Encounter: Payer: Self-pay | Admitting: Internal Medicine

## 2017-07-13 DIAGNOSIS — Z85118 Personal history of other malignant neoplasm of bronchus and lung: Secondary | ICD-10-CM

## 2017-07-13 DIAGNOSIS — C3490 Malignant neoplasm of unspecified part of unspecified bronchus or lung: Secondary | ICD-10-CM

## 2017-07-13 NOTE — Telephone Encounter (Signed)
Gave avs and calendar for December 2019 °

## 2017-07-13 NOTE — Progress Notes (Signed)
[place] Telephone:(336) [phone]   Fax:(336) [phone]  OFFICE PROGRESS NOTE  V[address], Lennette Bihari, MD [place] [address]  DIAGNOSIS: Recurrent non-small cell lung cancer, adenocarcinoma with negative EGFR and ALK mutations diagnosed initially as a stage IIB (T3, N0, Mx) in December 2012  PRIOR THERAPY:  1) Status post left lower lobectomy with lymph node dissection under the care of Dr. Elta Guadeloupe Very at [facility]. 2) status post 4 cycles of adjuvant systemic chemotherapy with cisplatin and Alimta.  CURRENT THERAPY: Observation.  INTERVAL HISTORY: Tami Parks 60 y.o. female returns to the clinic today for follow-up visit accompanied by her husband.  The patient is feeling fine except for shortness of breath with exertion.  She denied having any chest pain but has mild cough with no hemoptysis.  She denied having any recent weight loss or night sweats.  She has no nausea, vomiting, diarrhea or constipation.  She had a repeat CT scan of the chest performed recently and she is here for evaluation and discussion of her scan results.  MEDICAL HISTORY: Past Medical History:  Diagnosis Date  . GERD (gastroesophageal reflux disease)   . Hypothyroid   . Lung cancer ([place])     ALLERGIES:  is allergic to oxycodone and sulfa antibiotics.  MEDICATIONS:  Current Outpatient Medications  Medication Sig Dispense Refill  . diphenhydrAMINE (SOMINEX) 25 MG tablet Take 25 mg by mouth at bedtime as needed for sleep.    Marland Kitchen ketoconazole (NIZORAL) 2 % cream Apply 1 application topically daily as needed for irritation.    Marland Kitchen levothyroxine (SYNTHROID, LEVOTHROID) 88 MCG tablet Take 1 tablet (88 mcg total) by mouth daily before breakfast. 98 tablet 1  . omeprazole (PRILOSEC) 40 MG capsule     . vitamin C (ASCORBIC ACID) 500 MG tablet Take 500 mg by mouth daily as needed.     No current facility-administered medications for this visit.     SURGICAL HISTORY:  Past  Surgical History:  Procedure Laterality Date  . BREAST LUMPECTOMY Left 1983  . CATARACT EXTRACTION Right 2016   Dr. Loma Boston  . [place]  . CESAREAN SECTION  1984  . LOBECTOMY Left left lower lobe   2012  . lower left lobe removal  2012   Dr. Avelina Laine. Williemae Natter    REVIEW OF SYSTEMS:  A comprehensive review of systems was negative except for: Respiratory: positive for dyspnea on exertion   PHYSICAL EXAMINATION: General appearance: alert, cooperative and no distress Head: Normocephalic, without obvious abnormality, atraumatic Neck: no adenopathy, no JVD, supple, symmetrical, trachea midline and thyroid not enlarged, symmetric, no tenderness/mass/nodules Lymph nodes: Cervical, supraclavicular, and axillary nodes normal. Resp: clear to auscultation bilaterally Back: symmetric, no curvature. ROM normal. No CVA tenderness. Cardio: regular rate and rhythm, S1, S2 normal, no murmur, click, rub or gallop GI: soft, non-tender; bowel sounds normal; no masses,  no organomegaly Extremities: extremities normal, atraumatic, no cyanosis or edema  ECOG PERFORMANCE STATUS: 1 - Symptomatic but completely ambulatory  Blood pressure 134/74, pulse 63, temperature 98.6 F (37 C), temperature source Oral, resp. rate 18, height 5' 4.5" (1.638 m), weight 168 lb 1.6 oz (76.2 kg), SpO2 98 %.  LABORATORY DATA: Lab Results  Component Value Date   WBC 6.1 07/11/2017   HGB 14.6 07/11/2017   HCT 43.6 07/11/2017   MCV 88.3 07/11/2017   PLT 309 07/11/2017      Chemistry      Component Value  Date/Time   NA 143 07/11/2017 0803   K 3.9 07/11/2017 0803   CO2 25 07/11/2017 0803   BUN 15.5 07/11/2017 0803   CREATININE 1.0 07/11/2017 0803   GLU 88 10/27/2015      Component Value Date/Time   CALCIUM 9.7 07/11/2017 0803   ALKPHOS 103 07/11/2017 0803   AST 19 07/11/2017 0803   ALT 15 07/11/2017 0803   BILITOT 0.32 07/11/2017 0803       RADIOGRAPHIC STUDIES: Ct Chest Wo Contrast  Result  Date: 07/11/2017 CLINICAL DATA:  Recurrent left lower lobe lung adenocarcinoma status post left lower lobectomy in 2012. Interval observation. EXAM: CT CHEST WITHOUT CONTRAST TECHNIQUE: Multidetector CT imaging of the chest was performed following the standard protocol without IV contrast. COMPARISON:  01/13/2017 chest CT. FINDINGS: Cardiovascular: Normal heart size. No significant pericardial fluid/thickening. Mildly atherosclerotic nonaneurysmal thoracic aorta. Normal caliber pulmonary arteries. Mediastinum/Nodes: No discrete thyroid nodules. Unremarkable esophagus. No pathologically enlarged axillary, mediastinal or gross hilar lymph nodes, noting limited sensitivity for the detection of hilar adenopathy on this noncontrast study. Lungs/Pleura: No pneumothorax. No pleural effusion. Status post left lower lobectomy. Poorly marginated sub solid 10.5 x 5.2 cm basilar right upper lobe lung mass (series 5/ image 57), previously 10.4 x 5.2 cm using similar measurement technique, not appreciably changed. Poorly marginated subsolid 6.5 x 5.2 cm anterior right lower lobe lung mass (series 5/ image 86), previously 6.5 x 5.4 cm using similar measurement technique, not appreciably changed. Basilar left upper lobe poorly marginated subsolid 5.7 x 3.1 cm lung mass (series 5/ image 126), previously 5.1 x 2.3 cm, increased in size and density. Innumerable (> 30) sub solid pulmonary nodules scattered throughout both lungs involving all lung lobes, which are stable to mildly increased. Representative 7 mm basilar right lower lobe nodule (series 5/image 112), mildly increased from 4 mm. Peripheral left upper lobe 6 mm nodule (series 5/image 113), previously 5 mm, minimally increased. Right middle lobe 9 mm nodule (series 5/image 93), stable. Upper abdomen: Small hiatal hernia. Musculoskeletal: No aggressive appearing focal osseous lesions. Moderate thoracic spondylosis. IMPRESSION: 1. Mild interval progression of metastatic in the  lungs. Multifocal poorly marginated subsolid lung masses, increased in the basilar left upper lobe. Innumerable subsolid pulmonary nodules, a few of which have mildly increased in size. 2. No thoracic adenopathy. 3. Small hiatal hernia. Aortic Atherosclerosis (ICD10-I70.0). Electronically Signed   By: Ilona Sorrel M.D.   On: 07/11/2017 09:45    ASSESSMENT AND PLAN:  This is a very pleasant 61 years old never smoker white female with recurrent non-small cell lung cancer that was initially diagnosed as stage IIB adenocarcinoma with negative EGFR and ALK mutations. She is status post left upper lobectomy with lymph node dissection and has been observation for several years. The recent CT scan of the chest showed further increase in her disease. I personally and independently reviewed the scan images and discussed the results and showed the images to the patient and her husband today. The patient is still not interested in considering any treatment. She would like to continue having imaging studies but once a year to evaluate her condition.  She does not want to do the scan earlier. I will arrange for her to come back for follow-up visit in 1 year with repeat CT scan of the chest.  She was also advised to call immediately if she has any concerning symptoms in the interval. The patient voices understanding of current disease status and treatment options and is in  agreement with the current care plan. All questions were answered. The patient knows to call the clinic with any problems, questions or concerns. We can certainly see the patient much sooner if necessary. I spent 10 minutes counseling the patient face to face. The total time spent in the appointment was 15 minutes.  Disclaimer: This note was dictated with voice recognition software. Similar sounding words can inadvertently be transcribed and may not be corrected upon review.

## 2017-07-13 NOTE — Addendum Note (Signed)
Addended by: Lucile Crater on: 07/13/2017 10:39 AM   Modules accepted: Orders

## 2018-01-30 ENCOUNTER — Telehealth: Payer: Self-pay | Admitting: Medical Oncology

## 2018-01-30 ENCOUNTER — Other Ambulatory Visit: Payer: Self-pay | Admitting: Medical Oncology

## 2018-01-30 DIAGNOSIS — C3432 Malignant neoplasm of lower lobe, left bronchus or lung: Secondary | ICD-10-CM

## 2018-01-30 NOTE — Telephone Encounter (Signed)
Pt said her CT scan needs to be ordered without contrast because her insurance cost is higher with contrast.

## 2018-06-13 ENCOUNTER — Telehealth: Payer: Self-pay | Admitting: *Deleted

## 2018-06-13 NOTE — Telephone Encounter (Signed)
Notified pt of upcoming CT scan appt and MD f/u pt verbalized understanding.

## 2018-07-12 ENCOUNTER — Ambulatory Visit (HOSPITAL_COMMUNITY)
Admission: RE | Admit: 2018-07-12 | Discharge: 2018-07-12 | Disposition: A | Discharge status: Home / Self Care | Payer: No Typology Code available for payment source | Source: Ambulatory Visit | Attending: Internal Medicine | Admitting: Internal Medicine

## 2018-07-12 ENCOUNTER — Inpatient Hospital Stay: Payer: No Typology Code available for payment source | Attending: Internal Medicine

## 2018-07-12 DIAGNOSIS — C3432 Malignant neoplasm of lower lobe, left bronchus or lung: Secondary | ICD-10-CM | POA: Insufficient documentation

## 2018-07-12 DIAGNOSIS — Z79899 Other long term (current) drug therapy: Secondary | ICD-10-CM | POA: Insufficient documentation

## 2018-07-12 DIAGNOSIS — Z9221 Personal history of antineoplastic chemotherapy: Secondary | ICD-10-CM | POA: Diagnosis not present

## 2018-07-12 DIAGNOSIS — C3490 Malignant neoplasm of unspecified part of unspecified bronchus or lung: Secondary | ICD-10-CM

## 2018-07-12 LAB — CBC WITH DIFFERENTIAL/PLATELET
Abs Immature Granulocytes: 0.02 10*3/uL (ref 0.00–0.07)
Basophils Absolute: 0.1 10*3/uL (ref 0.0–0.1)
Basophils Relative: 1 %
Eosinophils Absolute: 0.1 10*3/uL (ref 0.0–0.5)
Eosinophils Relative: 2 %
HCT: 43.6 % (ref 36.0–46.0)
Hemoglobin: 14.5 g/dL (ref 12.0–15.0)
Immature Granulocytes: 0 %
Lymphocytes Relative: 29 %
Lymphs Abs: 1.8 10*3/uL (ref 0.7–4.0)
MCH: 29.7 pg (ref 26.0–34.0)
MCHC: 33.3 g/dL (ref 30.0–36.0)
MCV: 89.2 fL (ref 80.0–100.0)
Monocytes Absolute: 0.4 10*3/uL (ref 0.1–1.0)
Monocytes Relative: 7 %
Neutro Abs: 3.8 10*3/uL (ref 1.7–7.7)
Neutrophils Relative %: 61 %
Platelets: 295 10*3/uL (ref 150–400)
RBC: 4.89 MIL/uL (ref 3.87–5.11)
RDW: 12.7 % (ref 11.5–15.5)
WBC: 6.2 10*3/uL (ref 4.0–10.5)
nRBC: 0 % (ref 0.0–0.2)

## 2018-07-12 LAB — COMPREHENSIVE METABOLIC PANEL
ALT: 14 U/L (ref 0–44)
AST: 18 U/L (ref 15–41)
Albumin: 4 g/dL (ref 3.5–5.0)
Alkaline Phosphatase: 105 U/L (ref 38–126)
Anion gap: 9 (ref 5–15)
BUN: 16 mg/dL (ref 8–23)
CO2: 26 mmol/L (ref 22–32)
Calcium: 9.6 mg/dL (ref 8.9–10.3)
Chloride: 105 mmol/L (ref 98–111)
Creatinine, Ser: 0.98 mg/dL (ref 0.44–1.00)
GFR calc Af Amer: >60 mL/min (ref >60)
GFR calc non Af Amer: >60 mL/min (ref >60)
Glucose, Bld: 97 mg/dL (ref 70–99)
Potassium: 4 mmol/L (ref 3.5–5.1)
Sodium: 140 mmol/L (ref 135–145)
Total Bilirubin: 0.4 mg/dL (ref 0.3–1.2)
Total Protein: 7.4 g/dL (ref 6.5–8.1)

## 2018-07-17 ENCOUNTER — Inpatient Hospital Stay (HOSPITAL_BASED_OUTPATIENT_CLINIC_OR_DEPARTMENT_OTHER): Payer: No Typology Code available for payment source | Admitting: Internal Medicine

## 2018-07-17 ENCOUNTER — Encounter: Payer: Self-pay | Admitting: Internal Medicine

## 2018-07-17 VITALS — BP 130/79 | HR 65 | Temp 98.9°F | Resp 17 | Ht 65.5 in | Wt 166.4 lb

## 2018-07-17 DIAGNOSIS — C3432 Malignant neoplasm of lower lobe, left bronchus or lung: Secondary | ICD-10-CM

## 2018-07-17 DIAGNOSIS — Z79899 Other long term (current) drug therapy: Secondary | ICD-10-CM | POA: Diagnosis not present

## 2018-07-17 DIAGNOSIS — C3490 Malignant neoplasm of unspecified part of unspecified bronchus or lung: Secondary | ICD-10-CM

## 2018-07-17 DIAGNOSIS — Z9221 Personal history of antineoplastic chemotherapy: Secondary | ICD-10-CM

## 2018-07-17 NOTE — Progress Notes (Signed)
[place] [phone]   Fax:(336) [phone]  OFFICE PROGRESS NOTE  [place], [place] [address]  DIAGNOSIS: Recurrent non-small cell lung cancer, adenocarcinoma with negative EGFR and ALK mutations diagnosed initially as a stage IIB (T3, N0, Mx) in December 2012  PRIOR THERAPY:  1) Status post left lower lobectomy with lymph node dissection under the care of Dr. Elta Guadeloupe Very at [facility]. 2) status post 4 cycles of adjuvant systemic chemotherapy with cisplatin and Alimta.  CURRENT THERAPY: Observation.  She declined all treatment at this point.  INTERVAL HISTORY: Tami Parks 62 y.o. female returns to the clinic today for annual follow-up visit accompanied by her husband.  The patient continues to do fine today with no concerning complaints except for a slightly more shortness of breath than usual.  She denied having any recent chest pain or hemoptysis.  She has no weight loss or night sweats.  She has no headache or visual changes.  She notices small lump in the neck area.  She denied having any nausea, vomiting, diarrhea or constipation.  She had repeat CT scan of the chest performed recently and she is here for evaluation and discussion of her scan results.  MEDICAL HISTORY: Past Medical History:  Diagnosis Date  . GERD (gastroesophageal reflux disease)   . Hypothyroid   . Lung cancer ([place])     ALLERGIES:  is allergic to oxycodone and sulfa antibiotics.  MEDICATIONS:  Current Outpatient Medications  Medication Sig Dispense Refill  . diphenhydrAMINE (BENADRYL) 25 MG tablet Take by mouth.    Marland Kitchen ketoconazole (NIZORAL) 2 % cream Apply 1 application topically daily as needed for irritation.    Marland Kitchen levothyroxine (SYNTHROID, LEVOTHROID) 88 MCG tablet Take 1 tablet (88 mcg total) by mouth daily before breakfast. 98 tablet 1  . omeprazole (PRILOSEC) 40 MG capsule     . vitamin C (ASCORBIC ACID) 500 MG tablet Take 500  mg by mouth daily as needed.    . diphenhydrAMINE (SOMINEX) 25 MG tablet Take 25 mg by mouth at bedtime as needed for sleep.     No current facility-administered medications for this visit.     SURGICAL HISTORY:  Past Surgical History:  Procedure Laterality Date  . BREAST LUMPECTOMY Left 1983  . CATARACT EXTRACTION Right 2016   Dr. Loma Boston  . [place]  . CESAREAN SECTION  1984  . LOBECTOMY Left left lower lobe   2012  . lower left lobe removal  2012   Dr. Avelina Laine. Williemae Natter    REVIEW OF SYSTEMS:  A comprehensive review of systems was negative except for: Respiratory: positive for dyspnea on exertion   PHYSICAL EXAMINATION: General appearance: alert, cooperative and no distress Head: Normocephalic, without obvious abnormality, atraumatic Neck: no adenopathy, no JVD, supple, symmetrical, trachea midline and thyroid not enlarged, symmetric, no tenderness/mass/nodules Lymph nodes: Cervical, supraclavicular, and axillary nodes normal. Resp: clear to auscultation bilaterally Back: symmetric, no curvature. ROM normal. No CVA tenderness. Cardio: regular rate and rhythm, S1, S2 normal, no murmur, click, rub or gallop GI: soft, non-tender; bowel sounds normal; no masses,  no organomegaly Extremities: extremities normal, atraumatic, no cyanosis or edema  ECOG PERFORMANCE STATUS: 1 - Symptomatic but completely ambulatory  Blood pressure 130/79, pulse 65, temperature 98.9 F (37.2 C), temperature source Oral, resp. rate 17, height 5' 5.5" (1.664 m), weight 166 lb 6.4 oz (75.5 kg), SpO2 97 %.  LABORATORY DATA: Lab Results  Component Value Date   WBC 6.2 07/12/2018   HGB 14.5 07/12/2018   HCT 43.6 07/12/2018   MCV 89.2 07/12/2018   PLT 295 07/12/2018      Chemistry      Component Value Date/Time   NA 140 07/12/2018 1052   NA 143 07/11/2017 0803   K 4.0 07/12/2018 1052   K 3.9 07/11/2017 0803   CL 105 07/12/2018 1052   CO2 26 07/12/2018 1052   CO2 25 07/11/2017  0803   BUN 16 07/12/2018 1052   BUN 15.5 07/11/2017 0803   CREATININE 0.98 07/12/2018 1052   CREATININE 1.0 07/11/2017 0803   GLU 88 10/27/2015      Component Value Date/Time   CALCIUM 9.6 07/12/2018 1052   CALCIUM 9.7 07/11/2017 0803   ALKPHOS 105 07/12/2018 1052   ALKPHOS 103 07/11/2017 0803   AST 18 07/12/2018 1052   AST 19 07/11/2017 0803   ALT 14 07/12/2018 1052   ALT 15 07/11/2017 0803   BILITOT 0.4 07/12/2018 1052   BILITOT 0.32 07/11/2017 0803       RADIOGRAPHIC STUDIES: Ct Chest Wo Contrast  Result Date: 07/13/2018 CLINICAL DATA:  Left lower lobe lung adenocarcinoma status post left lower lobectomy in 2012, with recurrence status post chemotherapy. Patient presents for restaging on interval observation. EXAM: CT CHEST WITHOUT CONTRAST TECHNIQUE: Multidetector CT imaging of the chest was performed following the standard protocol without IV contrast. COMPARISON:  07/11/2017 chest CT. FINDINGS: Cardiovascular: Normal heart size. No significant pericardial effusion/thickening. Great vessels are normal in course and caliber. Mediastinum/Nodes: No discrete thyroid nodules. Unremarkable esophagus. No pathologically enlarged axillary, mediastinal or hilar lymph nodes, noting limited sensitivity for the detection of hilar adenopathy on this noncontrast study. Lungs/Pleura: No pneumothorax. No pleural effusion. Status post left lower lobectomy. There is widespread patchy consolidation and nodularity throughout both lungs of variable solid, sub solid and ground-glass density, increased in size, density, extent and number in the interval. Representative masslike focus of mixed solid and sub solid density in the medial basilar left upper lobe measuring 8.2 x 5.0 cm (series 7/image 95), significantly increased in density and increased in size from 5.7 x 3.1 cm. Representative peripheral basilar left upper lobe 1.0 cm sub solid pulmonary nodule (series 7/image 105), increased from 0.6 cm.  Representative masslike 6.6 x 5.5 cm focus of consolidation with air bronchograms in the anterior right lower lobe (series 7/image 83), previously 6.5 x 5.2 cm, slightly increased in size and significantly increased in density. Representative peripheral right upper lobe 10.6 x 6.0 cm masslike focus of consolidation with air bronchograms (series 7/image 53), previously 10.5 x 5.2 cm, mildly increased in size and significantly increased in density. Representative 1.0 cm sub solid medial right lower lobe pulmonary nodule (series 7/image 85), increased from 0.4 cm. Representative 1.2 cm sub solid right middle lobe pulmonary nodule (series 7/image 95), increased from 0 9 cm. Upper abdomen: Small hiatal hernia. Musculoskeletal: No aggressive appearing focal osseous lesions. Moderate thoracic spondylosis. IMPRESSION: 1. Interval progression of widespread bilateral pulmonary adenocarcinoma, which has increased in size, extent and density as detailed. 2. Small hiatal hernia. Electronically Signed   By: Ilona Sorrel M.D.   On: 07/13/2018 08:46    ASSESSMENT AND PLAN:  This is a very pleasant 62 years old never smoker white female with recurrent non-small cell lung cancer that was initially diagnosed as stage IIB adenocarcinoma with negative EGFR and ALK mutations. She is status post left upper lobectomy with lymph node dissection  and has been observation for several years.  She has been declining treatment over this years. She had repeat CT scan of the chest performed recently.  I personally and independently reviewed the scan images and discussed the result and showed the images to the patient and her husband today.  Her scan showed clear evidence for further disease progression of the wide spread bilateral adenocarcinoma that increased in size and density since the previous scan. I discussed with the patient her treatment options again including palliative care and hospice referral versus palliative systemic  chemotherapy in combination with immunotherapy but the patient preferred to continue on observation with no treatment at this point. I also discussed with the patient sending a blood test to [facility] to look for any molecular mutations that could be used to treat this patient but she would like to hold on this option until May 2020 until she has coverage with Medicare. I will arrange for the patient to come back for follow-up visit in September 2020 with repeat CT scan of the chest for restaging of her disease. She was advised to call immediately if she changes her mind think treatment or if she has any concerning symptoms in the interval. The patient voices understanding of current disease status and treatment options and is in agreement with the current care plan. All questions were answered. The patient knows to call the clinic with any problems, questions or concerns. We can certainly see the patient much sooner if necessary. I spent 10 minutes counseling the patient face to face. The total time spent in the appointment was 15 minutes.  Disclaimer: This note was dictated with voice recognition software. Similar sounding words can inadvertently be transcribed and may not be corrected upon review.

## 2018-10-26 IMAGING — CT CT CHEST W/O CM
2 of 4 series · 15 of 36 positions shown, 18 images · non-contrast
Comparison: [REDACTED] CT chest dated 05/10/2015

CLINICAL DATA: Restaging left lung cancer diagnosed 6636,
increasing lung nodules

EXAM:
CT CHEST WITHOUT CONTRAST
TECHNIQUE: Multidetector CT imaging of the chest was performed following the
standard protocol without IV contrast.

[Series 2: thorax · axial · 0.64mm/px · z∈[+1283,+1559]mm · 12 of 162 slices shown, 15 images]
[im 12/162  mediastinal]
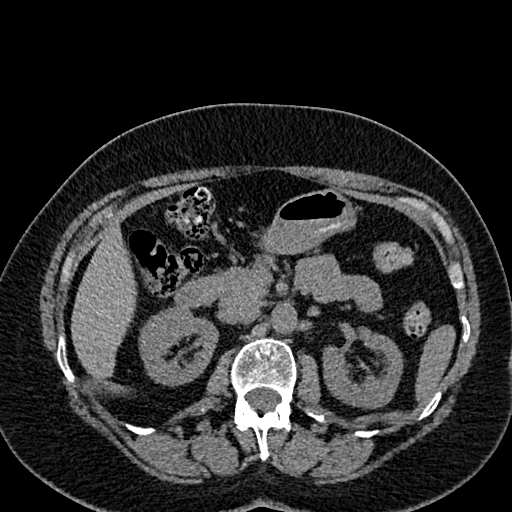
[im 12/162  lung]
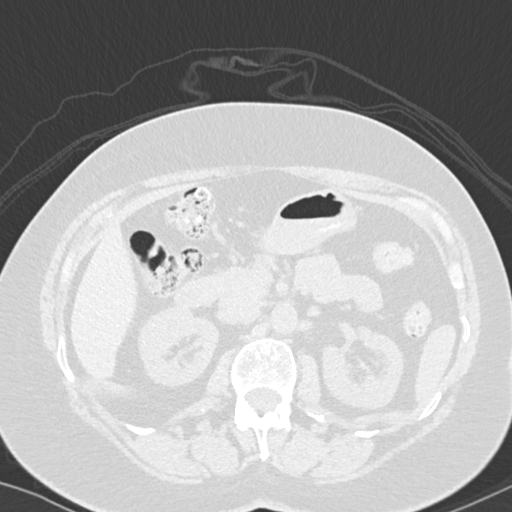
[im 24/162  lung]
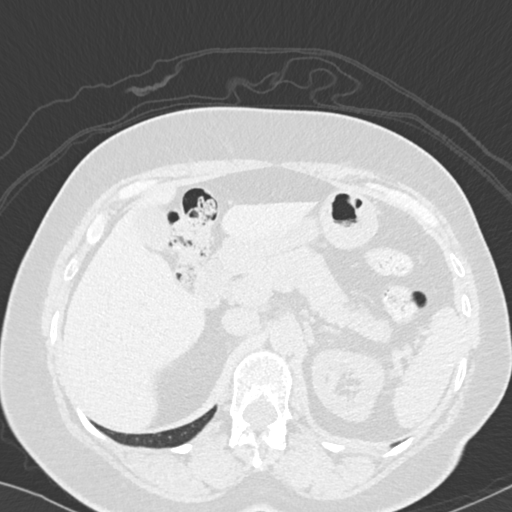
[im 35/162  lung]
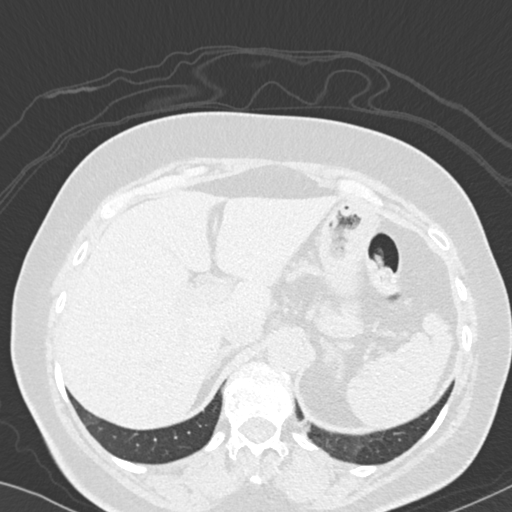
[im 47/162  lung]
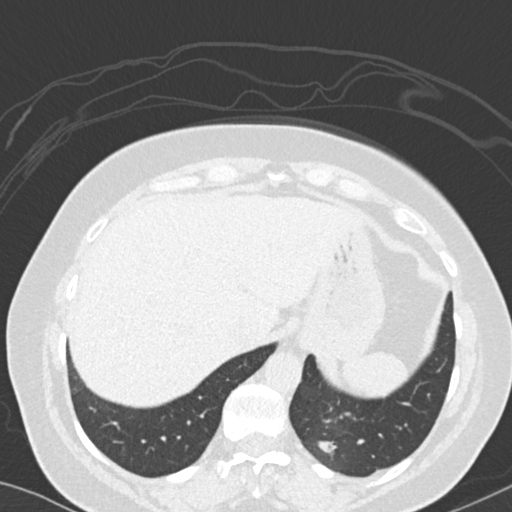
[im 58/162  mediastinal]
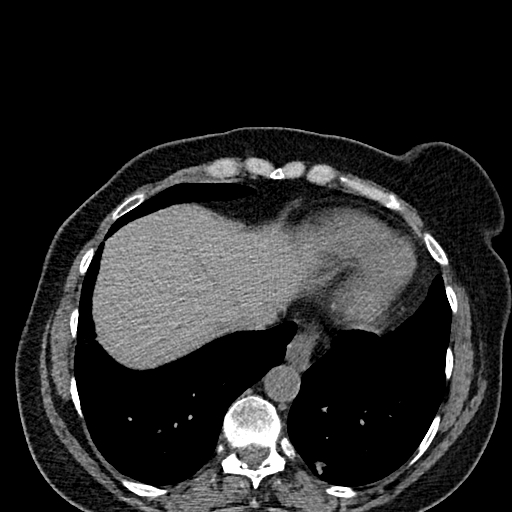
[im 58/162  lung]
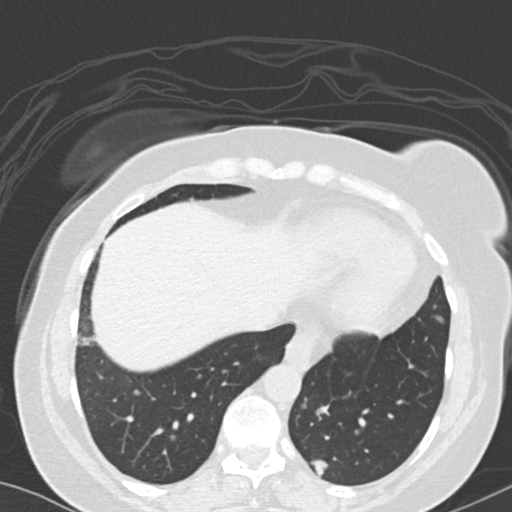
[im 70/162  lung]
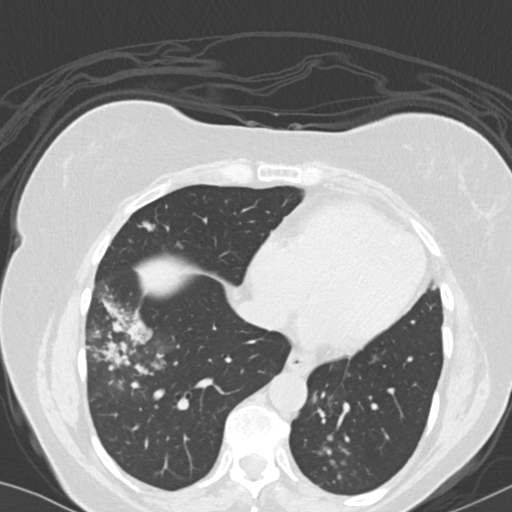
[im 93/162  lung]
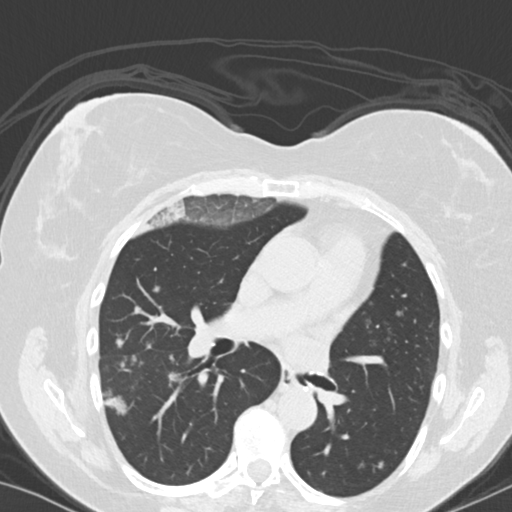
[im 104/162  lung]
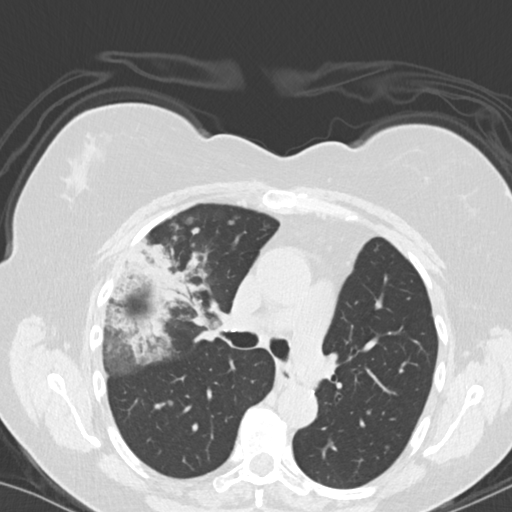
[im 116/162  mediastinal]
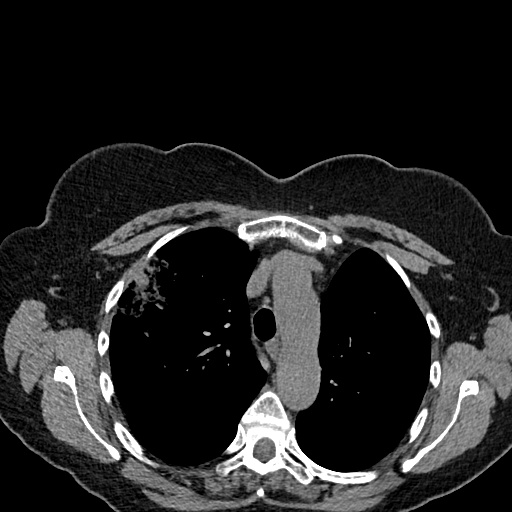
[im 116/162  lung]
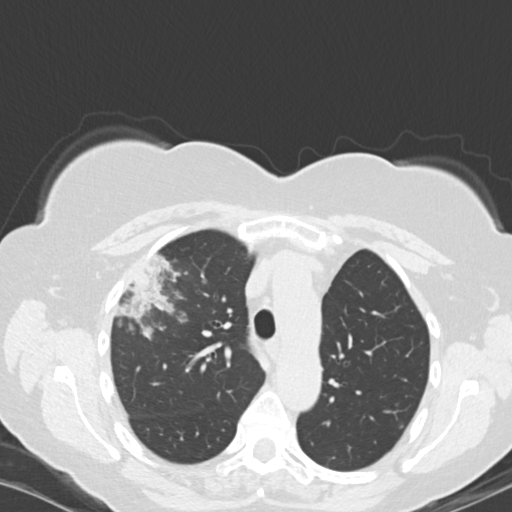
[im 127/162  lung]
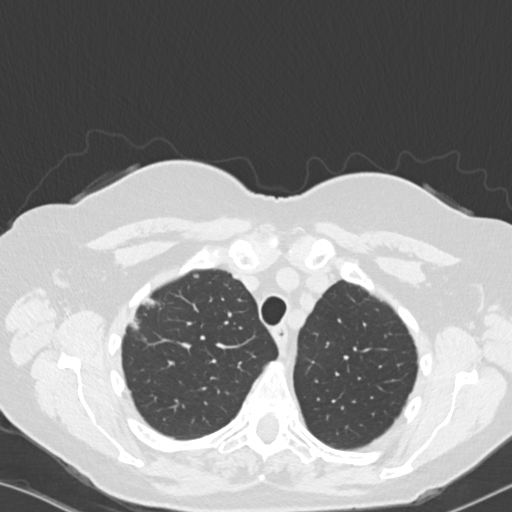
[im 139/162  lung]
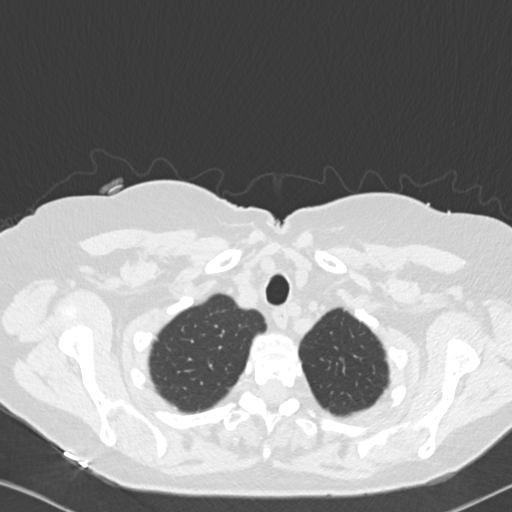
[im 150/162  lung]
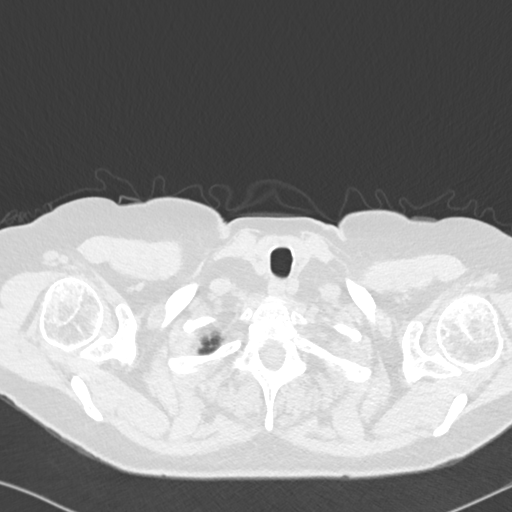

[Series 6: coronal · coronal · 0.63mm/px · 3 of 149 slices shown]
[im 30/149  lung]
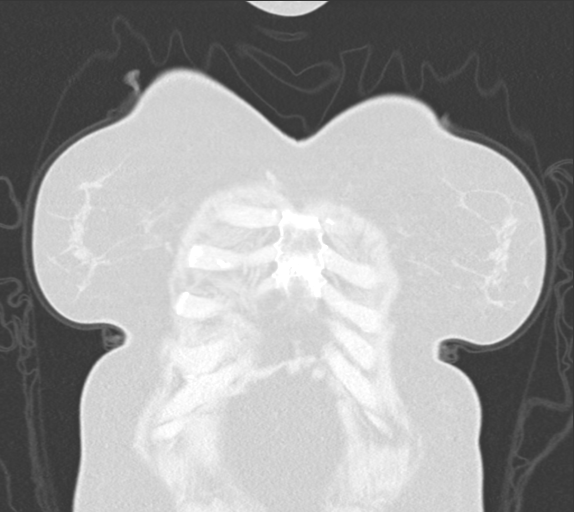
[im 60/149  lung]
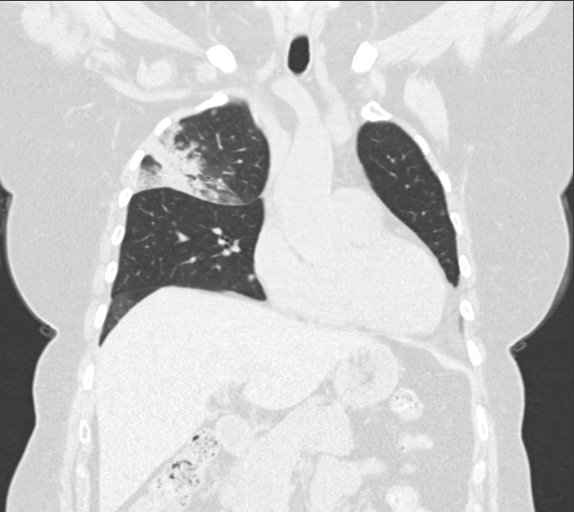
[im 89/149  lung]
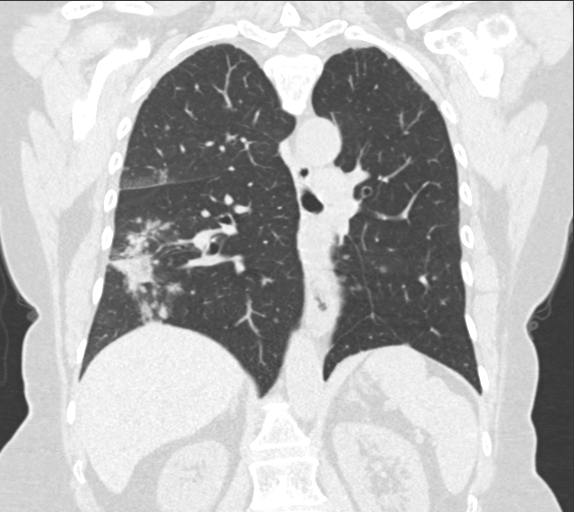

[15 of 36 positions shown; findings below may reference images not displayed]

FINDINGS: Cardiovascular: Heart is normal in size.  No pericardial effusion.

Very mild atherosclerotic calcifications of the aortic arch.

Mediastinum/Nodes: No suspicious mediastinal lymphadenopathy.

Visualized thyroid is unremarkable.

Lungs/Pleura:  Status post left lower lobectomy.

Ground-glass/coalescent nodule opacity in the right upper lobe
measuring at least 8.1 x 3.8 cm (series 5/image 57), previously
x 4.1 cm.

Ground-glass/ coalescent nodular opacity in the right lower lobe
measuring 5.9 x 4.3 cm (series 5/ image 89), previously a less solid
opacity measuring 5.8 x 3 4 cm.

Multiple discrete nodules in the left upper lobe measuring up to 13
mm posteriorly (series 5/ image 106), previously 7 mm.

No pleural effusion or pneumothorax.

Upper Abdomen: Visualized upper abdomen is unremarkable.

Musculoskeletal: Degenerative changes of the visualized
thoracolumbar spine.
IMPRESSION: Status post left lower lobectomy.

Multifocal nodules/masses bilaterally, measuring up to 8.1 cm in the
right upper lobe, compatible with progressive metastatic disease.

## 2019-04-13 ENCOUNTER — Inpatient Hospital Stay: Payer: Medicare Other | Attending: Internal Medicine

## 2019-04-13 ENCOUNTER — Ambulatory Visit (HOSPITAL_COMMUNITY)
Admission: RE | Admit: 2019-04-13 | Discharge: 2019-04-13 | Disposition: A | Discharge status: Home / Self Care | Payer: Medicare Other | Source: Ambulatory Visit | Attending: Internal Medicine | Admitting: Internal Medicine

## 2019-04-13 ENCOUNTER — Other Ambulatory Visit: Payer: Self-pay

## 2019-04-13 DIAGNOSIS — C3432 Malignant neoplasm of lower lobe, left bronchus or lung: Secondary | ICD-10-CM | POA: Diagnosis present

## 2019-04-13 DIAGNOSIS — Z79899 Other long term (current) drug therapy: Secondary | ICD-10-CM | POA: Diagnosis not present

## 2019-04-13 DIAGNOSIS — C3490 Malignant neoplasm of unspecified part of unspecified bronchus or lung: Secondary | ICD-10-CM | POA: Insufficient documentation

## 2019-04-13 DIAGNOSIS — E039 Hypothyroidism, unspecified: Secondary | ICD-10-CM | POA: Diagnosis not present

## 2019-04-13 LAB — CBC WITH DIFFERENTIAL (CANCER CENTER ONLY)
Abs Immature Granulocytes: 0.02 10*3/uL (ref 0.00–0.07)
Basophils Absolute: 0.1 10*3/uL (ref 0.0–0.1)
Basophils Relative: 1 %
Eosinophils Absolute: 0.2 10*3/uL (ref 0.0–0.5)
Eosinophils Relative: 3 %
HCT: 44.6 % (ref 36.0–46.0)
Hemoglobin: 15.2 g/dL — ABNORMAL HIGH (ref 12.0–15.0)
Immature Granulocytes: 0 %
Lymphocytes Relative: 29 %
Lymphs Abs: 2.1 10*3/uL (ref 0.7–4.0)
MCH: 30.5 pg (ref 26.0–34.0)
MCHC: 34.1 g/dL (ref 30.0–36.0)
MCV: 89.4 fL (ref 80.0–100.0)
Monocytes Absolute: 0.5 10*3/uL (ref 0.1–1.0)
Monocytes Relative: 6 %
Neutro Abs: 4.2 10*3/uL (ref 1.7–7.7)
Neutrophils Relative %: 61 %
Platelet Count: 305 10*3/uL (ref 150–400)
RBC: 4.99 MIL/uL (ref 3.87–5.11)
RDW: 12.7 % (ref 11.5–15.5)
WBC Count: 7 10*3/uL (ref 4.0–10.5)
nRBC: 0 % (ref 0.0–0.2)

## 2019-04-13 LAB — CMP (CANCER CENTER ONLY)
ALT: 10 U/L (ref 0–44)
AST: 17 U/L (ref 15–41)
Albumin: 4.2 g/dL (ref 3.5–5.0)
Alkaline Phosphatase: 88 U/L (ref 38–126)
Anion gap: 9 (ref 5–15)
BUN: 15 mg/dL (ref 8–23)
CO2: 27 mmol/L (ref 22–32)
Calcium: 9.7 mg/dL (ref 8.9–10.3)
Chloride: 106 mmol/L (ref 98–111)
Creatinine: 0.97 mg/dL (ref 0.44–1.00)
GFR, Est AFR Am: >60 mL/min (ref >60)
GFR, Est Non Af Am: >60 mL/min (ref >60)
Glucose, Bld: 88 mg/dL (ref 70–99)
Potassium: 4.3 mmol/L (ref 3.5–5.1)
Sodium: 142 mmol/L (ref 135–145)
Total Bilirubin: 0.3 mg/dL (ref 0.3–1.2)
Total Protein: 7.4 g/dL (ref 6.5–8.1)

## 2019-04-16 ENCOUNTER — Inpatient Hospital Stay (HOSPITAL_BASED_OUTPATIENT_CLINIC_OR_DEPARTMENT_OTHER): Payer: Medicare Other | Admitting: Internal Medicine

## 2019-04-16 ENCOUNTER — Other Ambulatory Visit: Payer: Self-pay

## 2019-04-16 ENCOUNTER — Encounter: Payer: Self-pay | Admitting: Internal Medicine

## 2019-04-16 VITALS — BP 130/91 | HR 84 | Temp 98.7°F | Resp 18 | Ht 65.5 in | Wt 156.5 lb

## 2019-04-16 DIAGNOSIS — C3490 Malignant neoplasm of unspecified part of unspecified bronchus or lung: Secondary | ICD-10-CM

## 2019-04-16 DIAGNOSIS — C3432 Malignant neoplasm of lower lobe, left bronchus or lung: Secondary | ICD-10-CM | POA: Diagnosis not present

## 2019-04-16 NOTE — Progress Notes (Signed)
[facility] [phone]) [phone]   Fax:(336) [phone]  OFFICE PROGRESS NOTE  [place], [place] [address]  DIAGNOSIS: Recurrent non-small cell lung cancer, adenocarcinoma with negative EGFR and ALK mutations diagnosed initially as a stage IIB (T3, N0, Mx) in December 2012  PRIOR THERAPY:  1) Status post left lower lobectomy with lymph node dissection under the care of Dr. Elta Guadeloupe Very at [facility]. 2) status post 4 cycles of adjuvant systemic chemotherapy with cisplatin and Alimta.  CURRENT THERAPY: Observation.  She declined all treatment at this point.  INTERVAL HISTORY: Tami Parks 63 y.o. female returns to the clinic today for follow-up visit.  The patient is feeling fine today with no concerning complaints except for shortness of breath with exertion.  She denied having any chest pain, or hemoptysis.  She lost around 10 pounds in the last year.  She denied having any nausea, vomiting, diarrhea or constipation.  She still declining all forms of treatment.  She had repeat CT scan of the chest performed recently and she is here for evaluation and discussion of her scan results.  MEDICAL HISTORY: Past Medical History:  Diagnosis Date  . GERD (gastroesophageal reflux disease)   . Hypothyroid   . Lung cancer ([place])     ALLERGIES:  is allergic to sulfa antibiotics and oxycodone.  MEDICATIONS:  Current Outpatient Medications  Medication Sig Dispense Refill  . diphenhydrAMINE (BENADRYL) 25 MG tablet Take by mouth.    . diphenhydrAMINE (SOMINEX) 25 MG tablet Take 25 mg by mouth at bedtime as needed for sleep.    . Diphenhydramine-APAP 12.5-325 MG/15ML LIQD Take by mouth.    Marland Kitchen ketoconazole (NIZORAL) 2 % cream Apply 1 application topically daily as needed for irritation.    Marland Kitchen levothyroxine (SYNTHROID, LEVOTHROID) 88 MCG tablet Take 1 tablet (88 mcg total) by mouth daily before breakfast. 98 tablet 1  . omeprazole (PRILOSEC)  40 MG capsule     . vitamin C (ASCORBIC ACID) 500 MG tablet Take 500 mg by mouth daily as needed.     No current facility-administered medications for this visit.     SURGICAL HISTORY:  Past Surgical History:  Procedure Laterality Date  . BREAST LUMPECTOMY Left 1983  . CATARACT EXTRACTION Right 2016   Dr. Loma Boston  . [place]  . CESAREAN SECTION  1984  . LOBECTOMY Left left lower lobe   2012  . lower left lobe removal  2012   Dr. Avelina Laine. [facility] Natter    REVIEW OF SYSTEMS:  A comprehensive review of systems was negative except for: Respiratory: positive for dyspnea on exertion   PHYSICAL EXAMINATION: General appearance: alert, cooperative and no distress Head: Normocephalic, without obvious abnormality, atraumatic Neck: no adenopathy, no JVD, supple, symmetrical, trachea midline and thyroid not enlarged, symmetric, no tenderness/mass/nodules Lymph nodes: Cervical, supraclavicular, and axillary nodes normal. Resp: rales bilaterally Back: symmetric, no curvature. ROM normal. No CVA tenderness. Cardio: regular rate and rhythm, S1, S2 normal, no murmur, click, rub or gallop GI: soft, non-tender; bowel sounds normal; no masses,  no organomegaly Extremities: extremities normal, atraumatic, no cyanosis or edema  ECOG PERFORMANCE STATUS: 1 - Symptomatic but completely ambulatory  Blood pressure (!) 130/91, pulse 84, temperature 98.7 F (37.1 C), temperature source Temporal, resp. rate 18, height 5' 5.5" (1.664 m), weight 156 lb 8 oz (71 kg), SpO2 95 %.  LABORATORY DATA: Lab Results  Component Value Date   WBC 7.0  04/13/2019   HGB 15.2 (H) 04/13/2019   HCT 44.6 04/13/2019   MCV 89.4 04/13/2019   PLT 305 04/13/2019      Chemistry      Component Value Date/Time   NA 142 04/13/2019 1057   NA 143 07/11/2017 0803   K 4.3 04/13/2019 1057   K 3.9 07/11/2017 0803   CL 106 04/13/2019 1057   CO2 27 04/13/2019 1057   CO2 25 07/11/2017 0803   BUN 15 04/13/2019 1057   BUN  15.5 07/11/2017 0803   CREATININE 0.97 04/13/2019 1057   CREATININE 1.0 07/11/2017 0803   GLU 88 10/27/2015      Component Value Date/Time   CALCIUM 9.7 04/13/2019 1057   CALCIUM 9.7 07/11/2017 0803   ALKPHOS 88 04/13/2019 1057   ALKPHOS 103 07/11/2017 0803   AST 17 04/13/2019 1057   AST 19 07/11/2017 0803   ALT 10 04/13/2019 1057   ALT 15 07/11/2017 0803   BILITOT 0.3 04/13/2019 1057   BILITOT 0.32 07/11/2017 0803       RADIOGRAPHIC STUDIES: Ct Chest Wo Contrast  Result Date: 04/13/2019 CLINICAL DATA:  History of lung cancer.  Restaging exam. EXAM: CT CHEST WITHOUT CONTRAST TECHNIQUE: Multidetector CT imaging of the chest was performed following the standard protocol without IV contrast. COMPARISON:  Chest CT 07/12/2018 FINDINGS: Cardiovascular: Normal heart size. Trace fluid superior pericardial recess. Thoracic aortic vascular calcifications. Mediastinum/Nodes: Moderate-sized hiatal hernia. Normal appearance of the esophagus. Interval increase in size of right internal mammary lymph node (image 58; series 2), measuring 1.0 cm, previously 0.5 cm. No definite hilar or axillary adenopathy. Small hiatal hernia. Lungs/Pleura: Central airways are patent. Interval progression of widespread patchy consolidative nodularity throughout the lungs bilaterally. Representative masslike focus of mixed solid and sub solid nodularity within the peripheral right lower lobe measures 6.1 x 8.1 cm (image 80; series 5), previously 6.5 x 5.5 cm. Similar-appearing masslike area within the peripheral right upper lobe measuring 10.1 x 5.8 cm (image 50; series 5), previously 10.6 x 5.9 cm. Postsurgical changes compatible with left lower lobectomy. Within the inferior aspect of the residual left upper lobe there is significant increased ground-glass opacity which now measures approximately 11.8 x 6.0 cm (image 95; series 5), previously 8.2 x 5.0 cm. Representative medial right lower lobe nodule increased in size  measuring 1.4 cm (image 87; series 5), previously 1.0 cm. Interval increase in size and number of multiple sub solid nodules throughout the upper aspect of the residual left upper lobe. No pleural effusion or pneumothorax. Upper Abdomen: No acute process. Musculoskeletal: Thoracic spine degenerative changes. IMPRESSION: Interval progression of widespread bilateral pulmonary adenocarcinoma. Electronically Signed   By: Lovey Newcomer M.D.   On: 04/13/2019 13:42    ASSESSMENT AND PLAN:  This is a very pleasant 63 years old never smoker white female with recurrent non-small cell lung cancer that was initially diagnosed as stage IIB adenocarcinoma with negative EGFR and ALK mutations. She is status post left upper lobectomy with lymph node dissection and has been observation for several years.   The patient has been declining treatment all the time. She had repeat CT scan of the chest performed recently.  I personally and independently reviewed the scan images and discussed the results with the patient and showed her the images today. Her scan continued to show disease progression.  The patient declined to consider any treatment or even molecular studies. She would like to come back for follow-up visit in 1 year with repeat  CT scan of the chest.  I will do the scan based on the patient his wishes but I do not see any need for it except monitoring her disease progression. She was advised to call immediately if she has any concerning symptoms in the interval. The patient voices understanding of current disease status and treatment options and is in agreement with the current care plan. All questions were answered. The patient knows to call the clinic with any problems, questions or concerns. We can certainly see the patient much sooner if necessary. I spent 10 minutes counseling the patient face to face. The total time spent in the appointment was 15 minutes.  Disclaimer: This note was dictated with voice  recognition software. Similar sounding words can inadvertently be transcribed and may not be corrected upon review.

## 2019-04-18 ENCOUNTER — Telehealth: Payer: Self-pay | Admitting: Internal Medicine

## 2019-04-18 NOTE — Telephone Encounter (Signed)
Scheduled appt per 9/21 los - mailed letter with appt date and time

## 2019-05-04 IMAGING — CT CT CHEST W/O CM
2 of 3 series · 15 of 36 positions shown, 18 images · non-contrast
Comparison: 07/07/2016.

CLINICAL DATA: Lung cancer followup. Status post left lower
lobectomy

EXAM:
CT CHEST WITHOUT CONTRAST
TECHNIQUE: Multidetector CT imaging of the chest was performed following the
standard protocol without IV contrast.

[Series 2: thorax · axial · 0.64mm/px · z∈[-339,-67]mm · 12 of 160 slices shown, 15 images]
[im 12/160  mediastinal]
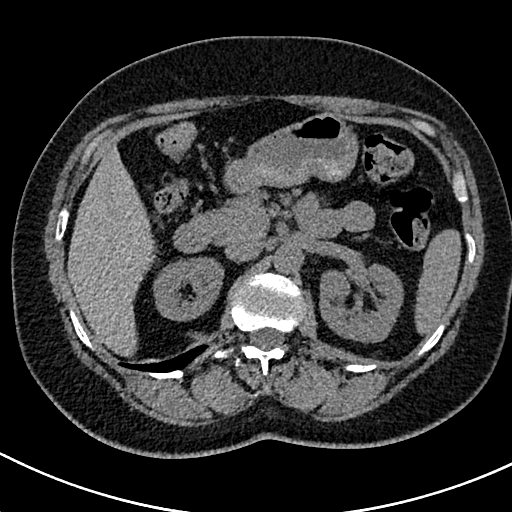
[im 12/160  lung]
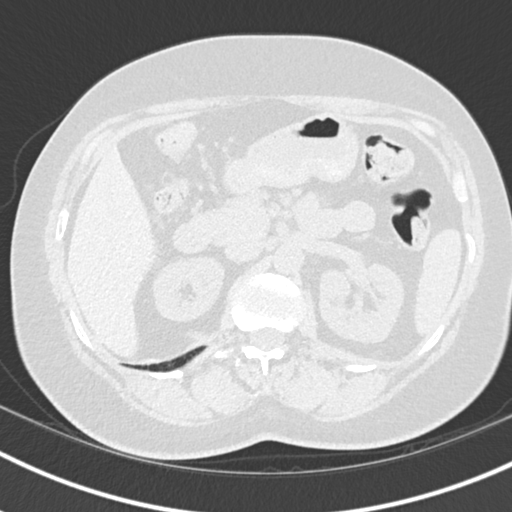
[im 24/160  lung]
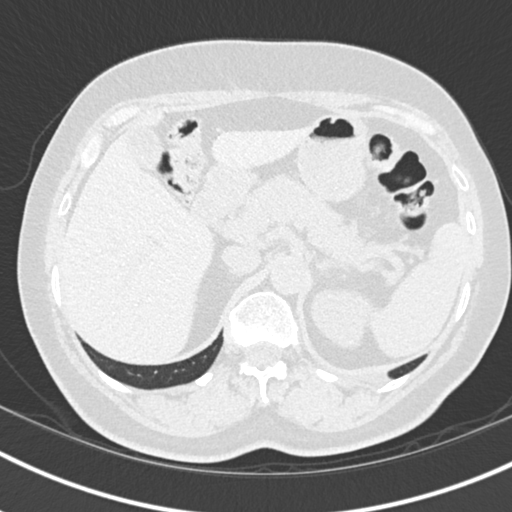
[im 36/160  lung]
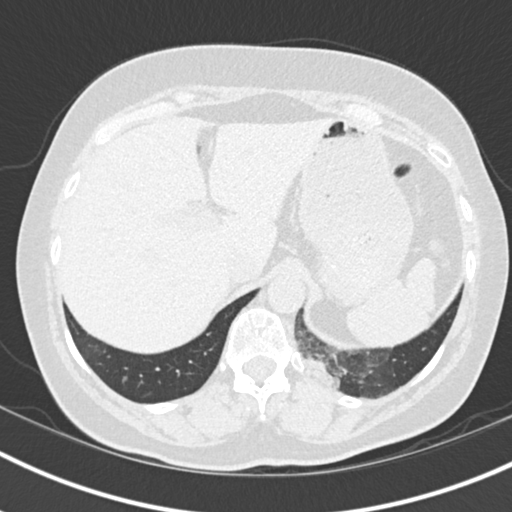
[im 48/160  lung]
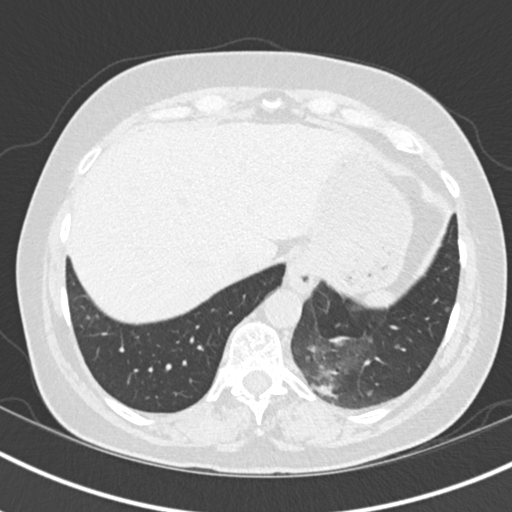
[im 59/160  mediastinal]
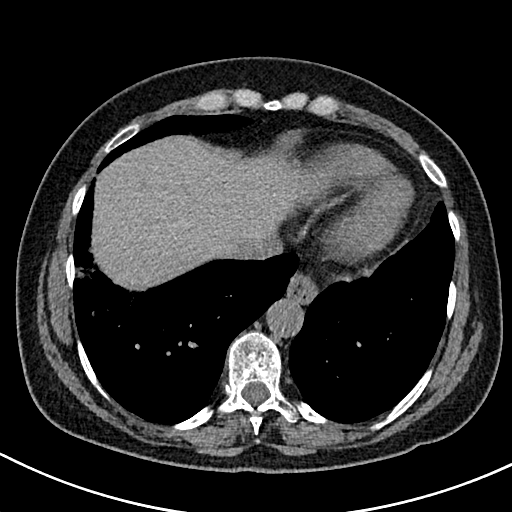
[im 59/160  lung]
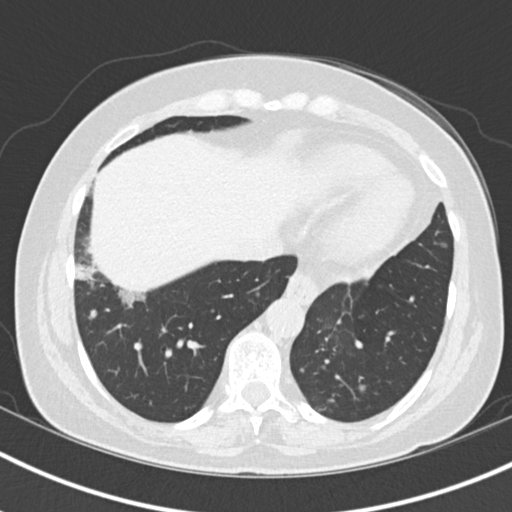
[im 71/160  lung]
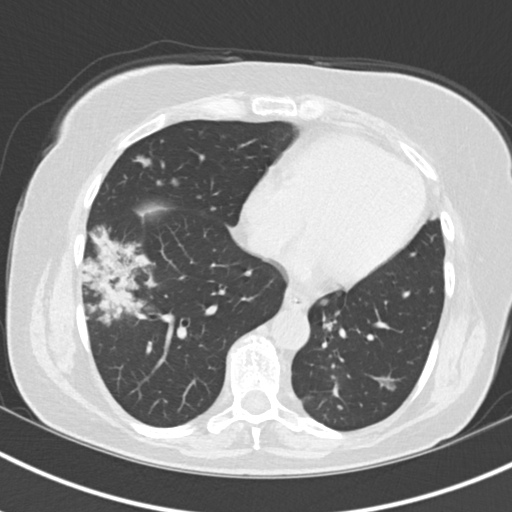
[im 89/160  lung]
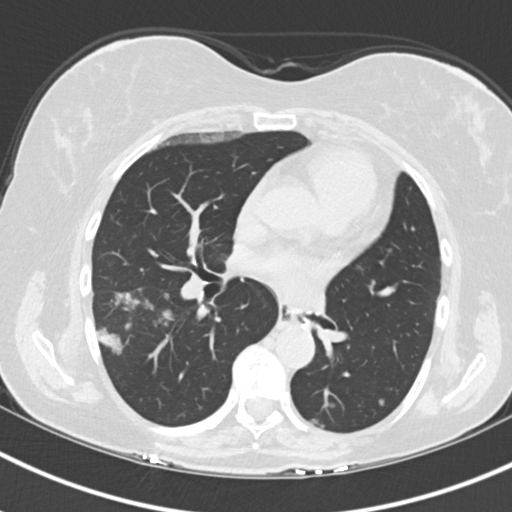
[im 101/160  lung]
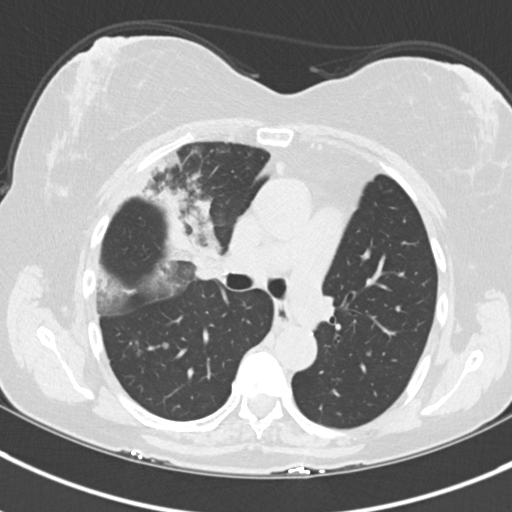
[im 112/160  mediastinal]
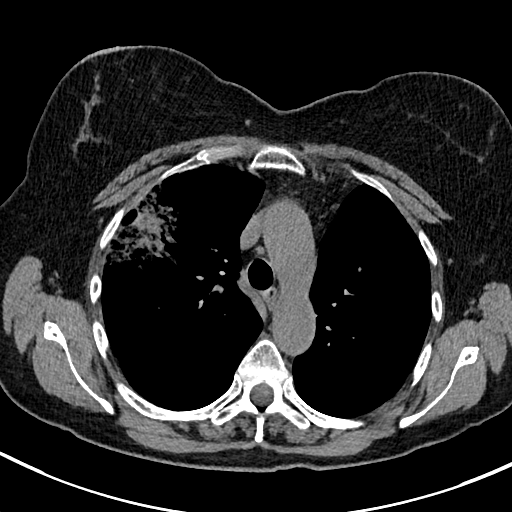
[im 112/160  lung]
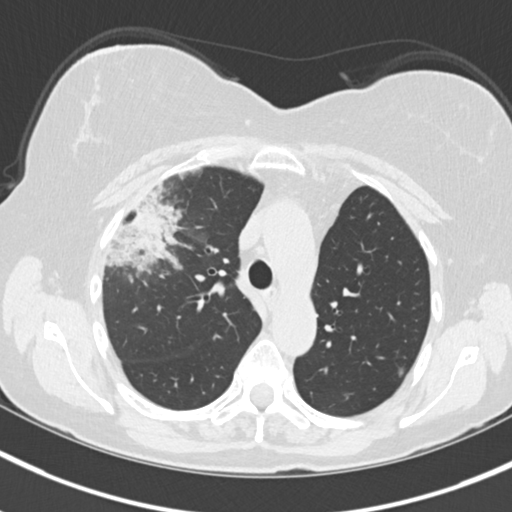
[im 124/160  lung]
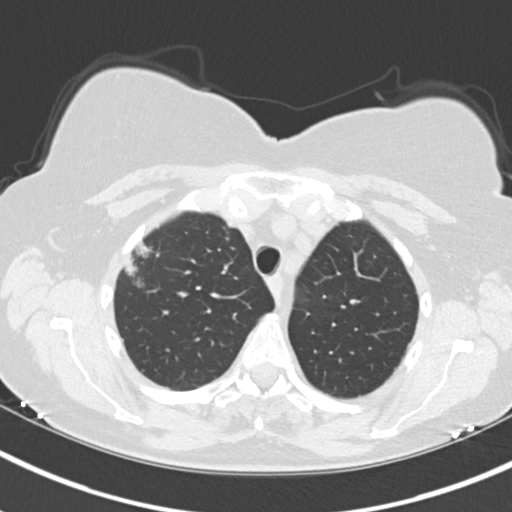
[im 136/160  lung]
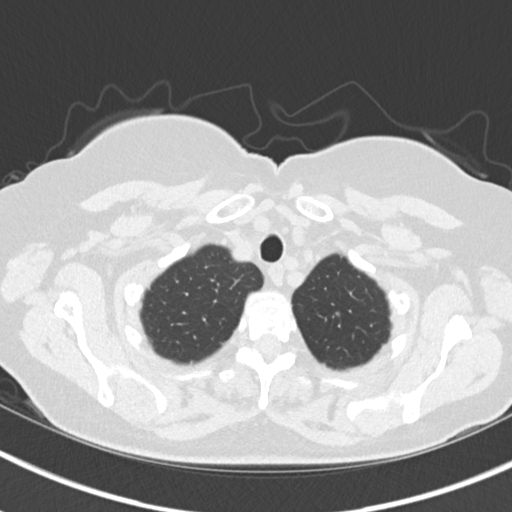
[im 148/160  lung]
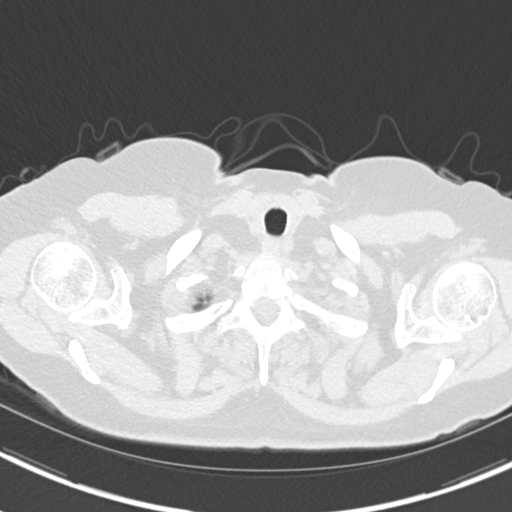

[Series 6: coronal · coronal · 0.62mm/px · 3 of 155 slices shown]
[im 31/155  lung]
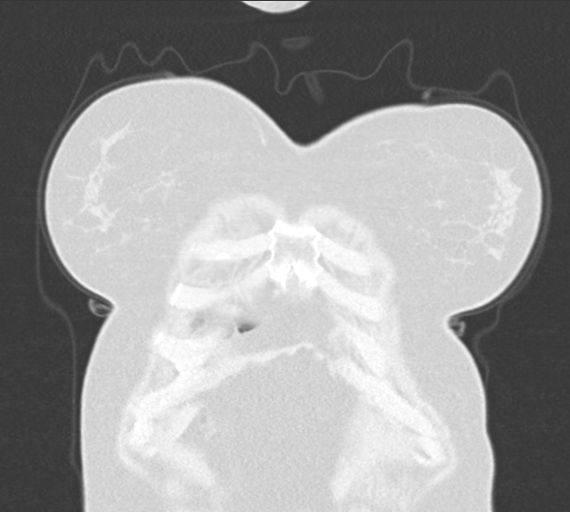
[im 62/155  lung]
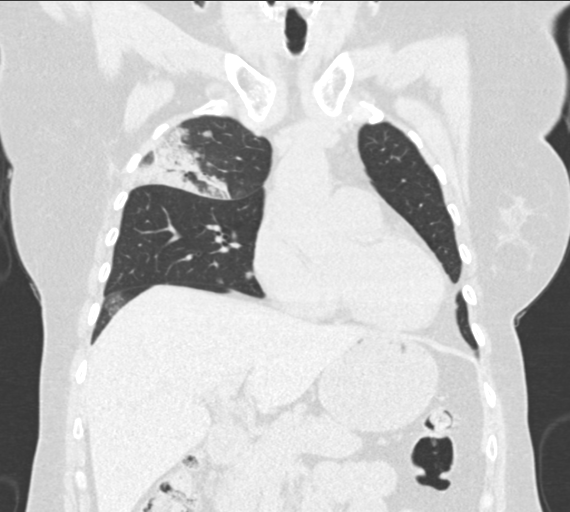
[im 93/155  lung]
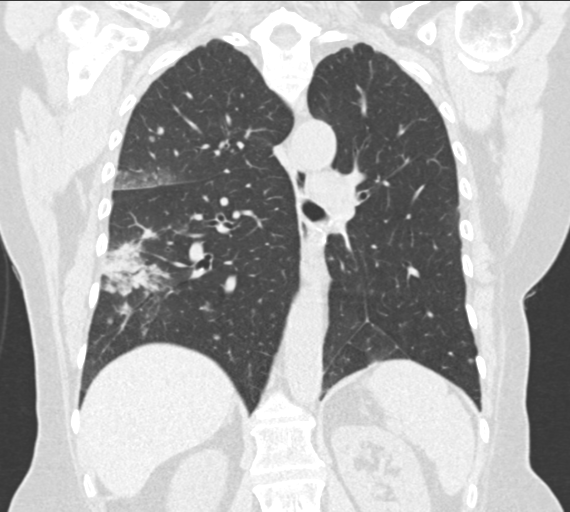

[15 of 36 positions shown; findings below may reference images not displayed]

FINDINGS: Cardiovascular: The heart size appears within normal limits. No
pericardial effusion.

Mediastinum/Nodes: The trachea appears patent and is midline. Small
hiatal hernia noted. No mediastinal or hilar adenopathy. No axillary
or supraclavicular adenopathy.

Lungs/Pleura: No pleural effusion identified. Multifocal pulmonary
lesions are again noted. The index ground-glass an coal less and
nodular opacity in the right upper lobe measures 7.7 x 4.8 cm, image
55 of series 5. Previously 8.1 x 3.8 cm. The ground-glass/coal less
and nodular opacity in the right lower lobe measures 5.4 x 3.8 cm,
image 87 of series 5. Previously 5.9 x 4.4 cm.

Multiple small nodules are identified throughout both lungs. The
index nodule in the posteromedial left upper lobe measures 1.2 cm,
image 109 of series 5. Previously 1.3 cm. Index nodule in the right
lower lobe measures 1.5 cm, image 58 of series 5. Previously 1.2 cm.
Index lesion within the right middle lobe measures 1.5 by 1.3 cm,
image 78 of series 5. Previously 1.1 x 1.2 cm.

Upper Abdomen: No acute abnormality.

Musculoskeletal: No chest wall mass or suspicious bone lesions
identified.
IMPRESSION: 1. Overall, allowing for subtle differences and morphology of the
large dominant cold less of lesions there has been no significant
change in the overall tumor burden within both lungs secondary to
metastatic adenocarcinoma of the left lower lobe.

## 2019-09-05 ENCOUNTER — Other Ambulatory Visit: Payer: Self-pay | Admitting: Physician Assistant

## 2019-09-05 DIAGNOSIS — R040 Epistaxis: Secondary | ICD-10-CM

## 2019-10-11 ENCOUNTER — Other Ambulatory Visit: Payer: Self-pay | Admitting: Physician Assistant

## 2019-10-11 ENCOUNTER — Other Ambulatory Visit: Payer: Self-pay

## 2019-10-11 ENCOUNTER — Ambulatory Visit
Admission: RE | Admit: 2019-10-11 | Discharge: 2019-10-11 | Disposition: A | Discharge status: Home / Self Care | Payer: Medicare Other | Source: Ambulatory Visit | Attending: Physician Assistant | Admitting: Physician Assistant

## 2019-10-11 DIAGNOSIS — R221 Localized swelling, mass and lump, neck: Secondary | ICD-10-CM

## 2019-10-30 IMAGING — CT CT CHEST W/O CM
2 of 4 series · 15 of 36 positions shown, 18 images · non-contrast
Comparison: 01/13/2017 chest CT.

CLINICAL DATA: Recurrent left lower lobe lung adenocarcinoma status
post left lower lobectomy in 9739. Interval observation.

EXAM:
CT CHEST WITHOUT CONTRAST
TECHNIQUE: Multidetector CT imaging of the chest was performed following the
standard protocol without IV contrast.

[Series 2: thorax · axial · 0.73mm/px · z∈[+1234,+1494]mm · 12 of 154 slices shown, 15 images]
[im 12/154  mediastinal]
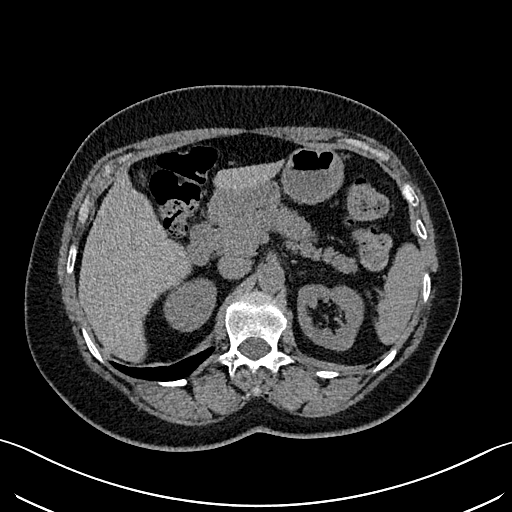
[im 12/154  lung]
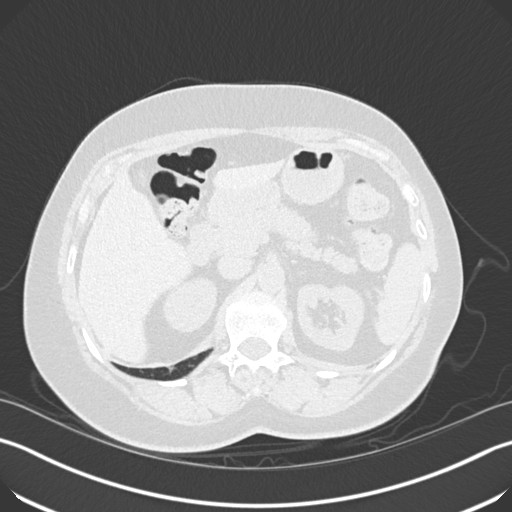
[im 24/154  lung]
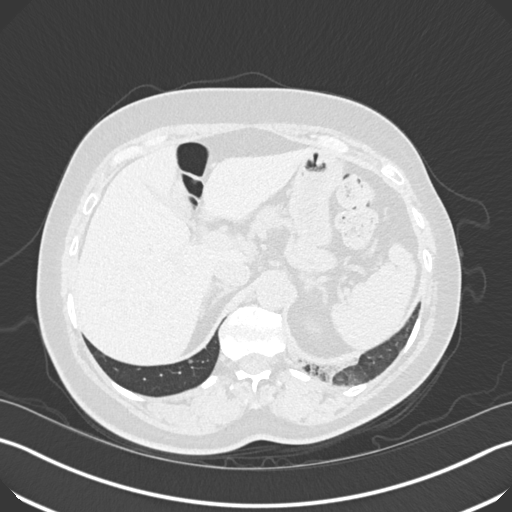
[im 36/154  lung]
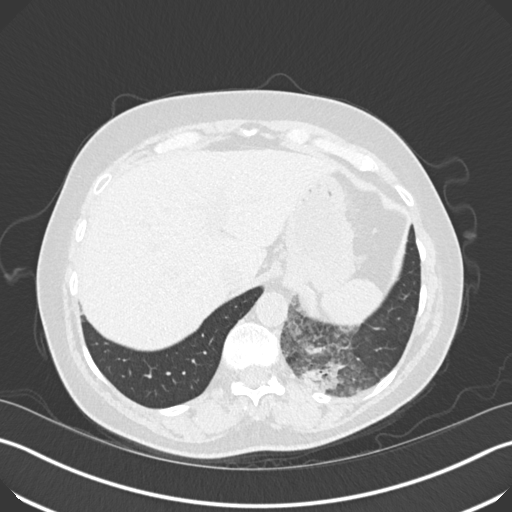
[im 48/154  lung]
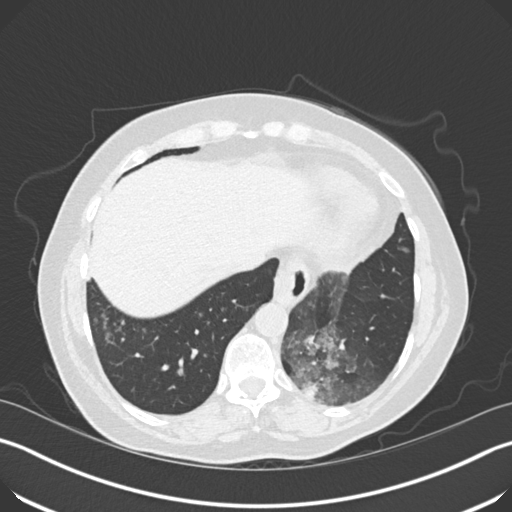
[im 59/154  mediastinal]
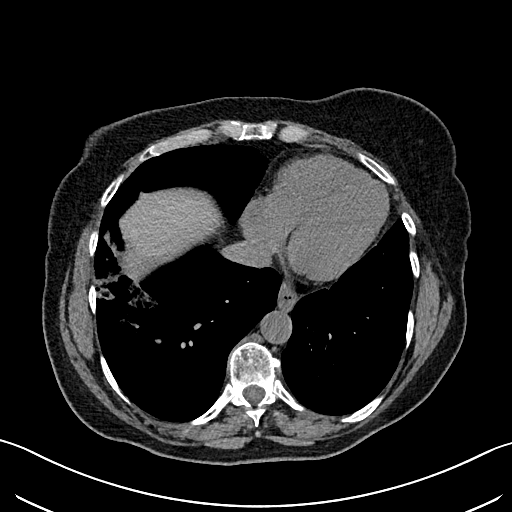
[im 59/154  lung]
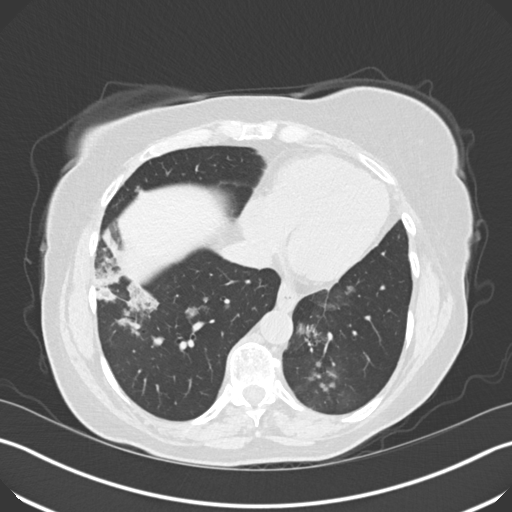
[im 71/154  lung]
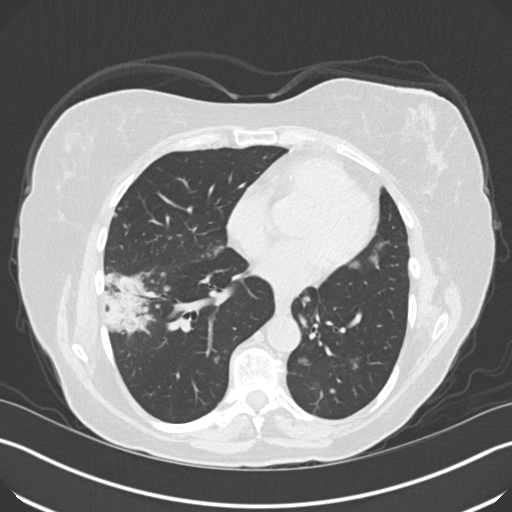
[im 83/154  lung]
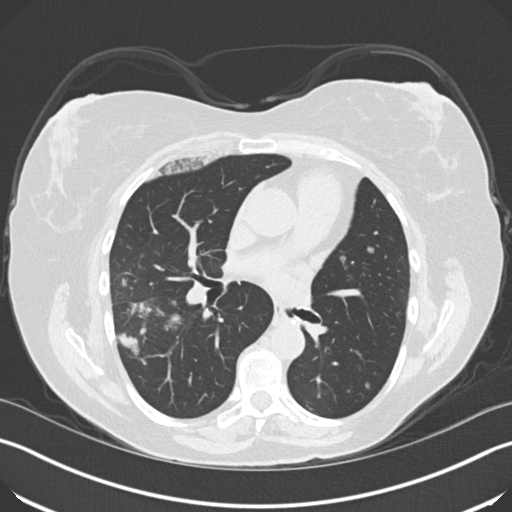
[im 95/154  lung]
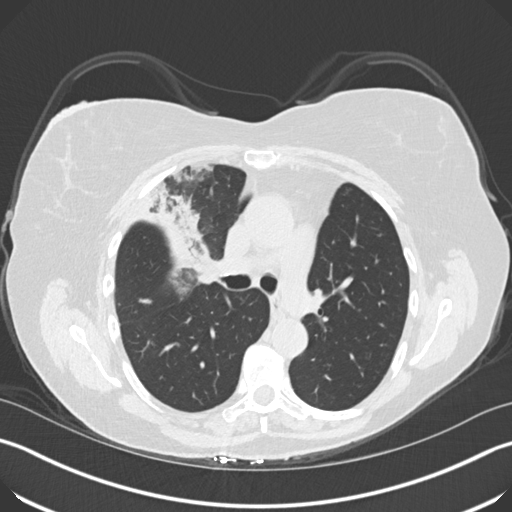
[im 106/154  mediastinal]
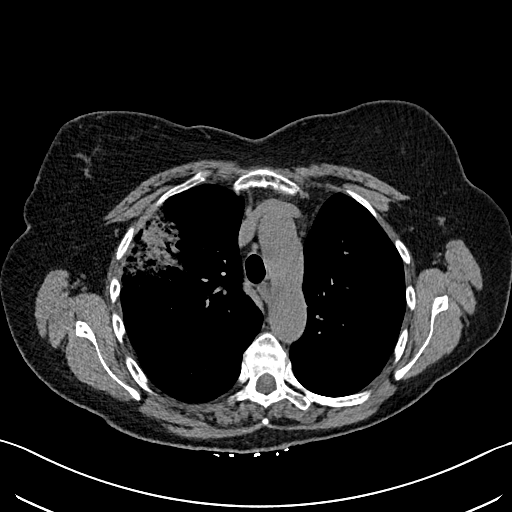
[im 106/154  lung]
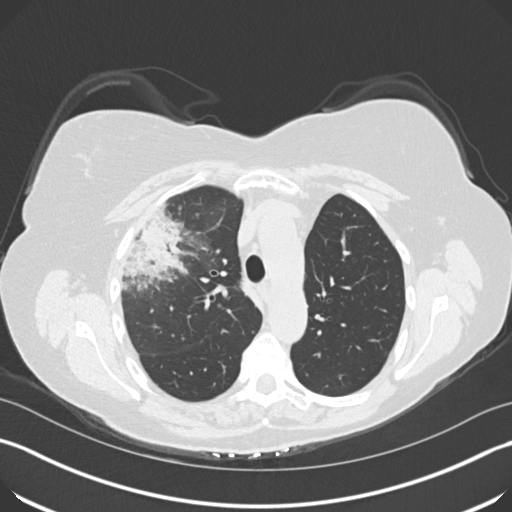
[im 118/154  lung]
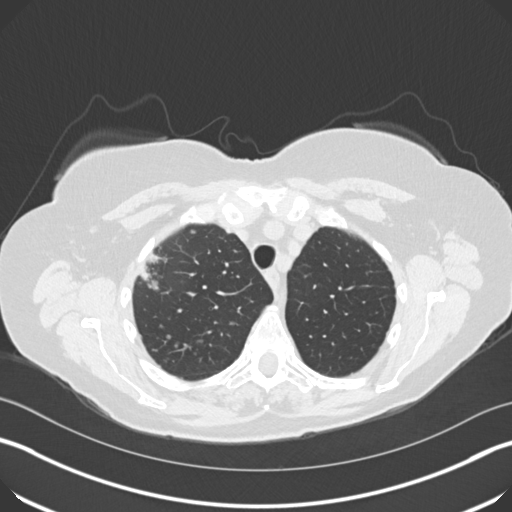
[im 130/154  lung]
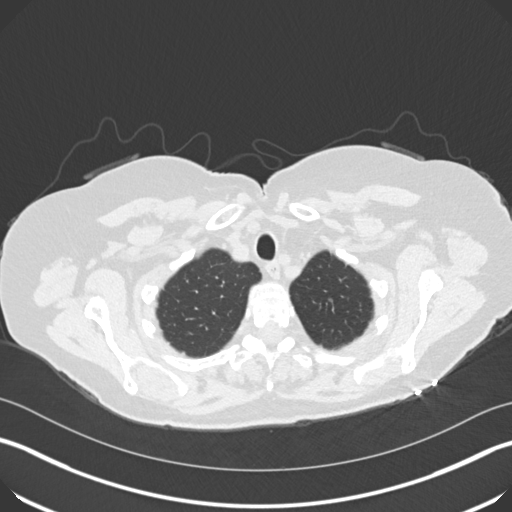
[im 142/154  lung]
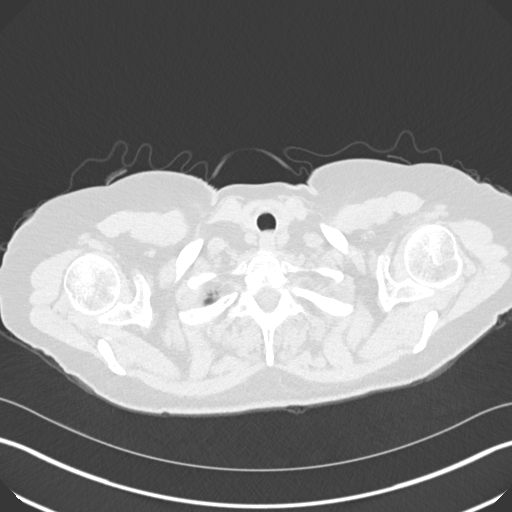

[Series 6: coronal · coronal · 0.64mm/px · 3 of 151 slices shown]
[im 31/151  lung]
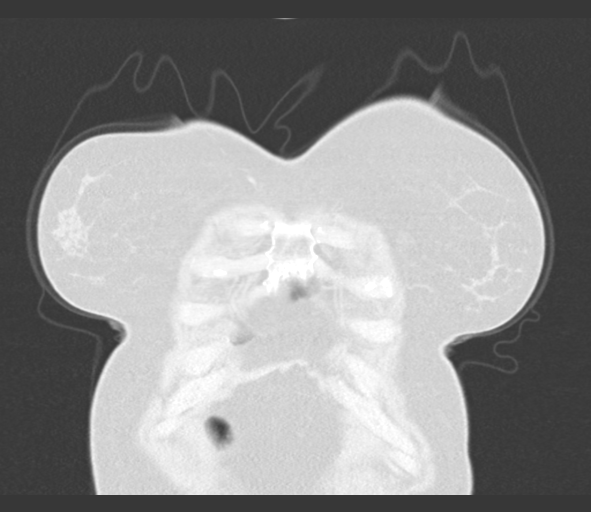
[im 61/151  lung]
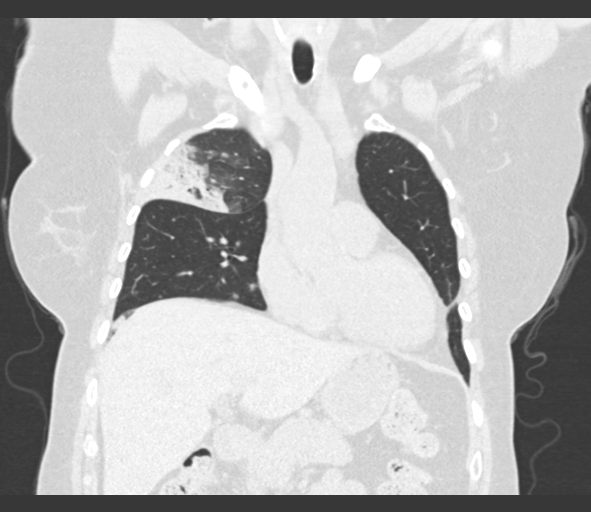
[im 91/151  lung]
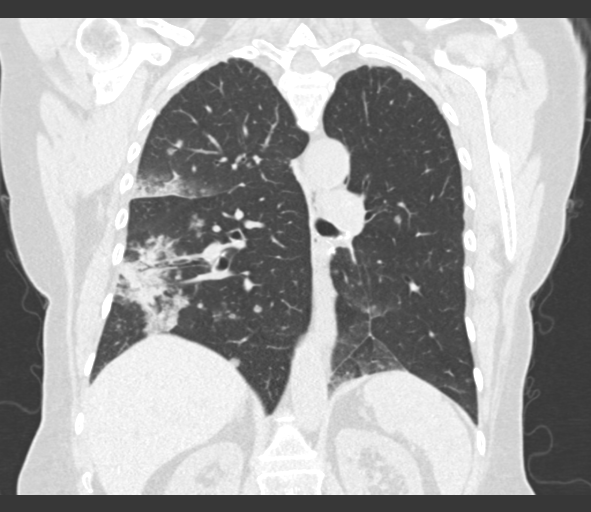

[15 of 36 positions shown; findings below may reference images not displayed]

FINDINGS: Cardiovascular: Normal heart size. No significant pericardial
fluid/thickening. Mildly atherosclerotic nonaneurysmal thoracic
aorta. Normal caliber pulmonary arteries.

Mediastinum/Nodes: No discrete thyroid nodules. Unremarkable
esophagus. No pathologically enlarged axillary, mediastinal or gross
hilar lymph nodes, noting limited sensitivity for the detection of
hilar adenopathy on this noncontrast study.

Lungs/Pleura: No pneumothorax. No pleural effusion. Status post left
lower lobectomy. Poorly marginated sub solid 10.5 x 5.2 cm basilar
right upper lobe lung mass (series 5/ image 57), previously 10.4 x
5.2 cm using similar measurement technique, not appreciably changed.
Poorly marginated subsolid 6.5 x 5.2 cm anterior right lower lobe
lung mass (series 5/ image 86), previously 6.5 x 5.4 cm using
similar measurement technique, not appreciably changed. Basilar left
upper lobe poorly marginated subsolid 5.7 x 3.1 cm lung mass (series
5/ image 126), previously 5.1 x 2.3 cm, increased in size and
density. Innumerable (> 30) sub solid pulmonary nodules scattered
throughout both lungs involving all lung lobes, which are stable to
mildly increased. Representative 7 mm basilar right lower lobe
nodule (series 5/image 112), mildly increased from 4 mm. Peripheral
left upper lobe 6 mm nodule (series 5/image 113), previously 5 mm,
minimally increased. Right middle lobe 9 mm nodule (series 5/image
93), stable.

Upper abdomen: Small hiatal hernia.

Musculoskeletal: No aggressive appearing focal osseous lesions.
Moderate thoracic spondylosis.
IMPRESSION: 1. Mild interval progression of metastatic in the lungs. Multifocal
poorly marginated subsolid lung masses, increased in the basilar
left upper lobe. Innumerable subsolid pulmonary nodules, a few of
which have mildly increased in size.
2. No thoracic adenopathy.
3. Small hiatal hernia.

Aortic Atherosclerosis ([id number]-DJ2.2).

## 2020-04-15 ENCOUNTER — Other Ambulatory Visit: Payer: Self-pay

## 2020-04-15 ENCOUNTER — Ambulatory Visit (HOSPITAL_COMMUNITY)
Admission: RE | Admit: 2020-04-15 | Discharge: 2020-04-15 | Disposition: A | Discharge status: Home / Self Care | Payer: Medicare Other | Source: Ambulatory Visit | Attending: Internal Medicine | Admitting: Internal Medicine

## 2020-04-15 ENCOUNTER — Inpatient Hospital Stay: Payer: Medicare Other | Attending: Internal Medicine

## 2020-04-15 DIAGNOSIS — C3490 Malignant neoplasm of unspecified part of unspecified bronchus or lung: Secondary | ICD-10-CM

## 2020-04-15 DIAGNOSIS — Z79899 Other long term (current) drug therapy: Secondary | ICD-10-CM | POA: Insufficient documentation

## 2020-04-15 DIAGNOSIS — E039 Hypothyroidism, unspecified: Secondary | ICD-10-CM | POA: Diagnosis not present

## 2020-04-15 DIAGNOSIS — C3432 Malignant neoplasm of lower lobe, left bronchus or lung: Secondary | ICD-10-CM | POA: Diagnosis present

## 2020-04-15 LAB — CBC WITH DIFFERENTIAL (CANCER CENTER ONLY)
Abs Immature Granulocytes: 0.02 10*3/uL (ref 0.00–0.07)
Basophils Absolute: 0.1 10*3/uL (ref 0.0–0.1)
Basophils Relative: 1 %
Eosinophils Absolute: 0.2 10*3/uL (ref 0.0–0.5)
Eosinophils Relative: 2 %
HCT: 44.9 % (ref 36.0–46.0)
Hemoglobin: 14.9 g/dL (ref 12.0–15.0)
Immature Granulocytes: 0 %
Lymphocytes Relative: 27 %
Lymphs Abs: 2 10*3/uL (ref 0.7–4.0)
MCH: 29.6 pg (ref 26.0–34.0)
MCHC: 33.2 g/dL (ref 30.0–36.0)
MCV: 89.1 fL (ref 80.0–100.0)
Monocytes Absolute: 0.5 10*3/uL (ref 0.1–1.0)
Monocytes Relative: 6 %
Neutro Abs: 4.7 10*3/uL (ref 1.7–7.7)
Neutrophils Relative %: 64 %
Platelet Count: 324 10*3/uL (ref 150–400)
RBC: 5.04 MIL/uL (ref 3.87–5.11)
RDW: 12.8 % (ref 11.5–15.5)
WBC Count: 7.5 10*3/uL (ref 4.0–10.5)
nRBC: 0 % (ref 0.0–0.2)

## 2020-04-15 LAB — CMP (CANCER CENTER ONLY)
ALT: 15 U/L (ref 0–44)
AST: 17 U/L (ref 15–41)
Albumin: 4 g/dL (ref 3.5–5.0)
Alkaline Phosphatase: 100 U/L (ref 38–126)
Anion gap: 6 (ref 5–15)
BUN: 10 mg/dL (ref 8–23)
CO2: 29 mmol/L (ref 22–32)
Calcium: 9.8 mg/dL (ref 8.9–10.3)
Chloride: 105 mmol/L (ref 98–111)
Creatinine: 0.88 mg/dL (ref 0.44–1.00)
GFR, Est AFR Am: >60 mL/min (ref >60)
GFR, Estimated: >60 mL/min (ref >60)
Glucose, Bld: 93 mg/dL (ref 70–99)
Potassium: 4.6 mmol/L (ref 3.5–5.1)
Sodium: 140 mmol/L (ref 135–145)
Total Bilirubin: 0.5 mg/dL (ref 0.3–1.2)
Total Protein: 7.5 g/dL (ref 6.5–8.1)

## 2020-04-17 ENCOUNTER — Inpatient Hospital Stay (HOSPITAL_BASED_OUTPATIENT_CLINIC_OR_DEPARTMENT_OTHER): Payer: Medicare Other | Admitting: Internal Medicine

## 2020-04-17 ENCOUNTER — Encounter: Payer: Self-pay | Admitting: Internal Medicine

## 2020-04-17 ENCOUNTER — Other Ambulatory Visit: Payer: Self-pay

## 2020-04-17 VITALS — BP 131/77 | HR 66 | Temp 97.5°F | Resp 17 | Ht 65.5 in | Wt 140.4 lb

## 2020-04-17 DIAGNOSIS — C3432 Malignant neoplasm of lower lobe, left bronchus or lung: Secondary | ICD-10-CM | POA: Diagnosis not present

## 2020-04-17 DIAGNOSIS — C3490 Malignant neoplasm of unspecified part of unspecified bronchus or lung: Secondary | ICD-10-CM

## 2020-04-17 NOTE — Progress Notes (Signed)
[place] [phone]   [phone]) [phone]  OFFICE PROGRESS NOTE  [address], [place] [address]  DIAGNOSIS: Recurrent non-small cell lung cancer, adenocarcinoma with negative EGFR and ALK mutations diagnosed initially as a stage IIB (T3, N0, Mx) in December 2012  PRIOR THERAPY:  1) Status post left lower lobectomy with lymph node dissection under the care of Dr. Elta Guadeloupe Very at [facility]. 2) status post 4 cycles of adjuvant systemic chemotherapy with cisplatin and Alimta.  CURRENT THERAPY: Observation.  She declined all treatment at this point.  INTERVAL HISTORY: Tami Parks 64 y.o. female returns to the clinic today for follow-up visit.  The patient is feeling fine today with no concerning complaints but she lost around 17 pounds since her last visit a year ago.  She denied having any chest pain but has shortness of breath with exertion and dry cough with no hemoptysis.  She denied having any fever or chills.  She has no nausea, vomiting, diarrhea or constipation.  She has no headache or visual changes.  She continues to refuse any form of treatment but she has repeat CT scan of the chest performed recently and she is here for evaluation and discussion of her risk her results.  MEDICAL HISTORY: Past Medical History:  Diagnosis Date  . GERD (gastroesophageal reflux disease)   . Hypothyroid   . Lung cancer ([place])     ALLERGIES:  is allergic to sulfa antibiotics and oxycodone.  MEDICATIONS:  Current Outpatient Medications  Medication Sig Dispense Refill  . diphenhydrAMINE (BENADRYL) 25 MG tablet Take by mouth.    . diphenhydrAMINE (SOMINEX) 25 MG tablet Take 25 mg by mouth at bedtime as needed for sleep.    . Diphenhydramine-APAP 12.5-325 MG/15ML LIQD Take by mouth.    Marland Kitchen ketoconazole (NIZORAL) 2 % cream Apply 1 application topically daily as needed for irritation.    Marland Kitchen levothyroxine (SYNTHROID, LEVOTHROID) 88  MCG tablet Take 1 tablet (88 mcg total) by mouth daily before breakfast. 98 tablet 1  . omeprazole (PRILOSEC) 40 MG capsule     . vitamin C (ASCORBIC ACID) 500 MG tablet Take 500 mg by mouth daily as needed.     No current facility-administered medications for this visit.    SURGICAL HISTORY:  Past Surgical History:  Procedure Laterality Date  . BREAST LUMPECTOMY Left 1983  . CATARACT EXTRACTION Right 2016   Dr. Loma Boston  . [place]  . CESAREAN SECTION  1984  . LOBECTOMY Left left lower lobe   2012  . lower left lobe removal  2012   Dr. Avelina Laine. Williemae Natter    REVIEW OF SYSTEMS:  A comprehensive review of systems was negative except for: Constitutional: positive for weight loss Respiratory: positive for cough and dyspnea on exertion   PHYSICAL EXAMINATION: General appearance: alert, cooperative and no distress Head: Normocephalic, without obvious abnormality, atraumatic Neck: no adenopathy, no JVD, supple, symmetrical, trachea midline and thyroid not enlarged, symmetric, no tenderness/mass/nodules Lymph nodes: Cervical, supraclavicular, and axillary nodes normal. Resp: rales bilaterally Back: symmetric, no curvature. ROM normal. No CVA tenderness. Cardio: regular rate and rhythm, S1, S2 normal, no murmur, click, rub or gallop GI: soft, non-tender; bowel sounds normal; no masses,  no organomegaly Extremities: extremities normal, atraumatic, no cyanosis or edema  ECOG PERFORMANCE STATUS: 1 - Symptomatic but completely ambulatory  Blood pressure 131/77, pulse 66, temperature (!) 97.5 F (36.4 C), temperature source Tympanic, resp.  rate 17, height 5' 5.5" (1.664 m), weight 140 lb 6.4 oz (63.7 kg), SpO2 95 %.  LABORATORY DATA: Lab Results  Component Value Date   WBC 7.5 04/15/2020   HGB 14.9 04/15/2020   HCT 44.9 04/15/2020   MCV 89.1 04/15/2020   PLT 324 04/15/2020      Chemistry      Component Value Date/Time   NA 140 04/15/2020 0918   NA 143 07/11/2017 0803    K 4.6 04/15/2020 0918   K 3.9 07/11/2017 0803   CL 105 04/15/2020 0918   CO2 29 04/15/2020 0918   CO2 25 07/11/2017 0803   BUN 10 04/15/2020 0918   BUN 15.5 07/11/2017 0803   CREATININE 0.88 04/15/2020 0918   CREATININE 1.0 07/11/2017 0803   GLU 88 10/27/2015 0000      Component Value Date/Time   CALCIUM 9.8 04/15/2020 0918   CALCIUM 9.7 07/11/2017 0803   ALKPHOS 100 04/15/2020 0918   ALKPHOS 103 07/11/2017 0803   AST 17 04/15/2020 0918   AST 19 07/11/2017 0803   ALT 15 04/15/2020 0918   ALT 15 07/11/2017 0803   BILITOT 0.5 04/15/2020 0918   BILITOT 0.32 07/11/2017 0803       RADIOGRAPHIC STUDIES: CT Chest Wo Contrast  Result Date: 04/15/2020 CLINICAL DATA:  Primary Cancer Type: Lung Imaging Indication: Active Surveillance Interval therapy since last imaging? No Initial Cancer Diagnosis Date: 06/2011; Established by: Biopsy-proven Detailed Pathology: Stage IIb non-small cell lung cancer, adenocarcinoma. Primary Tumor location: Left lower lobe. Recurrence? Yes; Date(s) of recurrence: 2017; Established by: Imaging only Surgeries: Left lower lobectomy 2012 at [facility]. Chemotherapy: Yes; Ongoing?  No; Most recent administration: 2012 Immunotherapy? No Radiation therapy? No EXAM: CT CHEST WITHOUT CONTRAST TECHNIQUE: Multidetector CT imaging of the chest was performed following the standard protocol without IV contrast. COMPARISON:  Most recent CT chest 04/13/2019. FINDINGS: Cardiovascular: Normal heart size. No significant pericardial effusion/thickening. Great vessels are normal in course and caliber. Mediastinum/Nodes: Thyroid is either surgically absent or atrophic. Unremarkable esophagus. No pathologically enlarged axillary, mediastinal or hilar lymph nodes, noting limited sensitivity for the detection of hilar adenopathy on this noncontrast study. Lungs/Pleura: No pneumothorax. No pleural effusion. Status post left lower lobectomy. Extensive patchy regions of dense consolidation with air  bronchograms throughout both lungs with surrounding patchy and nodular regions of ground-glass opacity throughout both lungs. Findings overall appear qualitatively mildly progressive since 04/13/2019 CT, although findings are difficult to measure quantitatively. Representative 0.7 cm apical right upper lobe nodule (series 7/image 17), increased from 0.2 cm. Confluent region of patchy consolidation and nodular ground-glass opacity spanning the right middle and right lower lobes measures 16.9 x 5.6 cm (series 7/image 75), increased from 15.0 x 4.9 cm using similar measurement technique. Large indistinct region of patchy consolidation and ground-glass opacity in the basilar left upper lobe has increased in density in the interval (series 7/image 80). There is increased volume loss in the lower lungs bilaterally. Upper abdomen: Small hiatal hernia. Musculoskeletal: No aggressive appearing focal osseous lesions. Moderate thoracic spondylosis. IMPRESSION: 1. Extensive involvement of both lungs by adenocarcinoma, appearing mildly progressive in the interval as detailed. 2. Increased volume loss in the lower lungs bilaterally. 3. Status post left lower lobectomy. 4. No thoracic adenopathy. 5. Small hiatal hernia. Electronically Signed   By: Ilona Sorrel M.D.   On: 04/15/2020 10:21    ASSESSMENT AND PLAN:  This is a very pleasant 64 years old never smoker white female with recurrent non-small cell lung  cancer that was initially diagnosed as stage IIB adenocarcinoma with negative EGFR and ALK mutations. She is status post left upper lobectomy with lymph node dissection and has been observation for several years.   The patient has been declining treatment all the time. The patient had repeat CT scan of the chest performed recently.  I personally and independently reviewed the scan images and discussed the results with the patient today. Her scan continued to show the extensive involvement of both lungs by the  adenocarcinoma and this is mildly progressive in the interval. I discussed the scan results with the patient and she still has no interest in any treatment. I will see her back for follow-up visit in 1 year for evaluation with repeat CT scan of the chest for restaging of her disease. She was advised to call immediately if she has any concerning symptoms in the interval. The patient voices understanding of current disease status and treatment options and is in agreement with the current care plan. All questions were answered. The patient knows to call the clinic with any problems, questions or concerns. We can certainly see the patient much sooner if necessary.  Disclaimer: This note was dictated with voice recognition software. Similar sounding words can inadvertently be transcribed and may not be corrected upon review.

## 2020-10-30 IMAGING — CT CT CHEST W/O CM
2 of 4 series · 14 of 36 positions shown, 17 images · non-contrast
Comparison: 07/11/2017 chest CT.

CLINICAL DATA: Left lower lobe lung adenocarcinoma status post left
lower lobectomy in 6416, with recurrence status post chemotherapy.
Patient presents for restaging on interval observation.

EXAM:
CT CHEST WITHOUT CONTRAST
TECHNIQUE: Multidetector CT imaging of the chest was performed following the
standard protocol without IV contrast.

[Series 2: thorax · axial · 0.72mm/px · z∈[-484,-236]mm · 11 of 148 slices shown, 14 images]
[im 12/148  mediastinal]
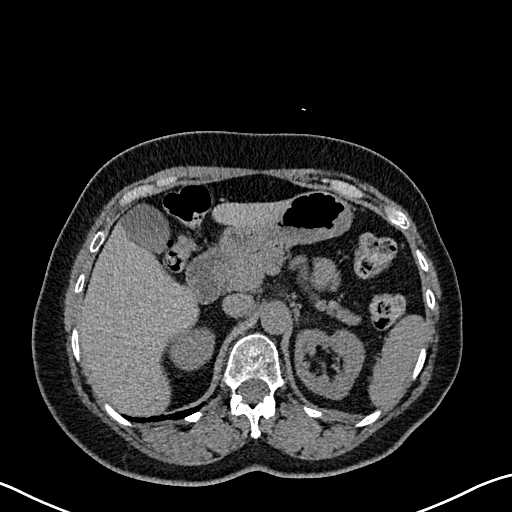
[im 12/148  lung]
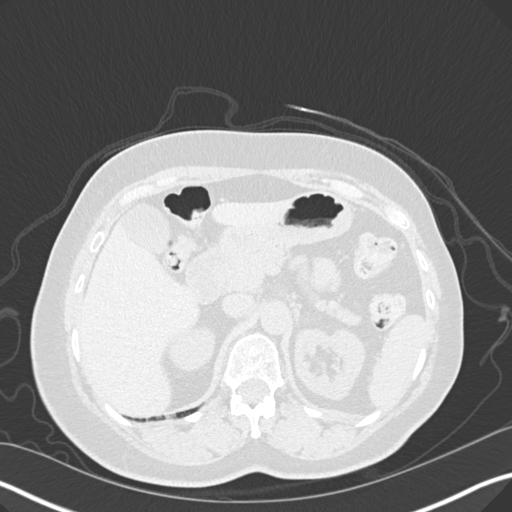
[im 23/148  lung]
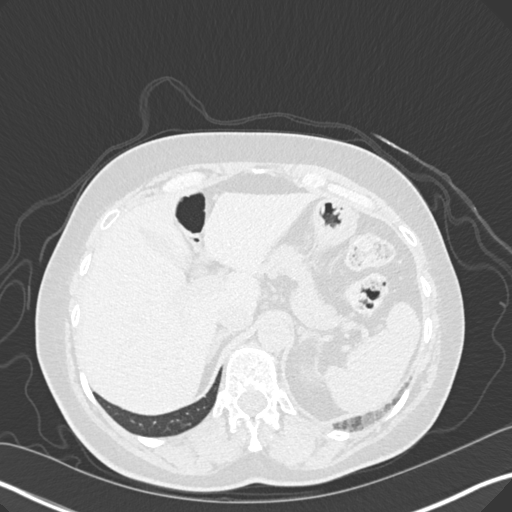
[im 34/148  lung]
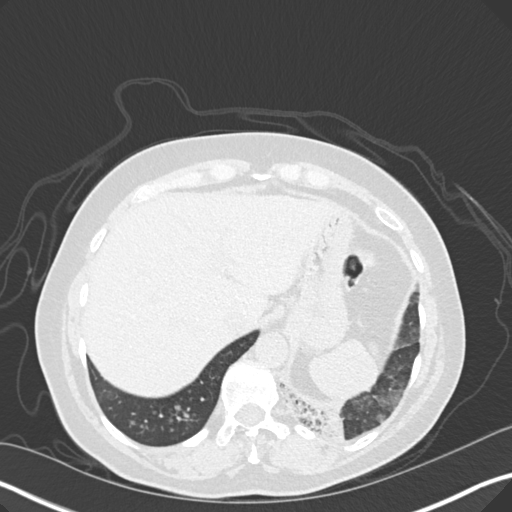
[im 46/148  lung]
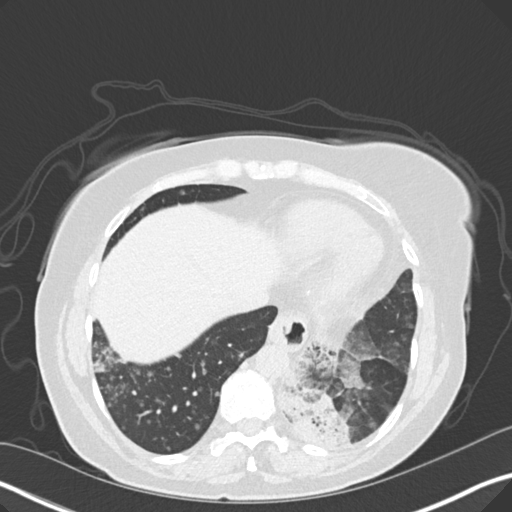
[im 57/148  mediastinal]
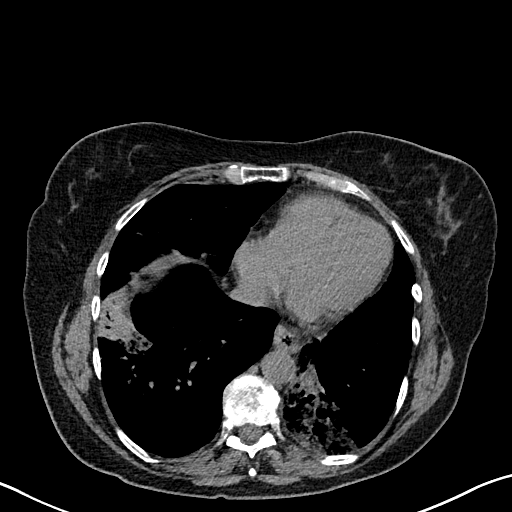
[im 57/148  lung]
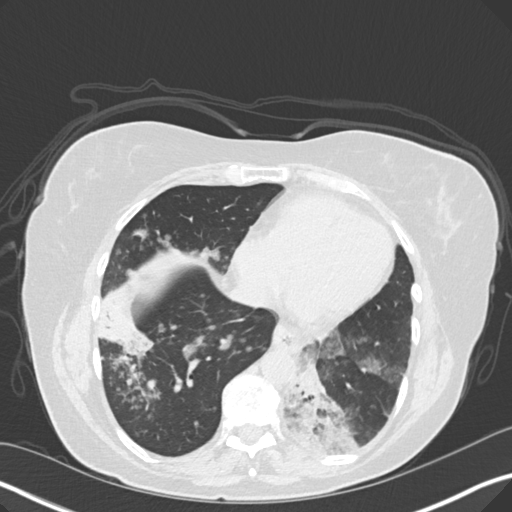
[im 80/148  lung]
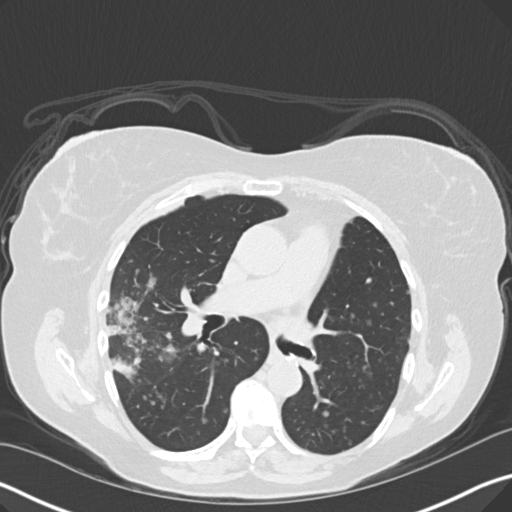
[im 91/148  lung]
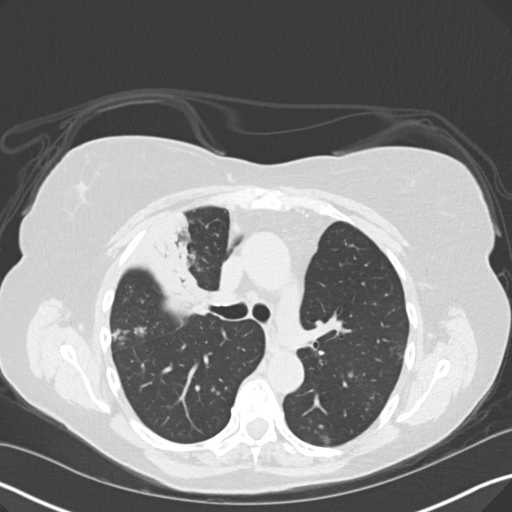
[im 102/148  lung]
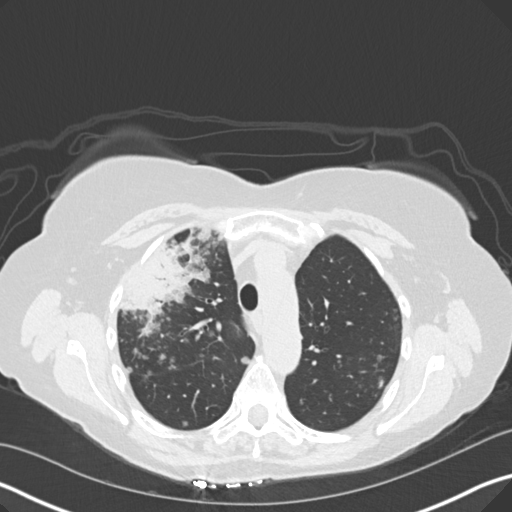
[im 114/148  mediastinal]
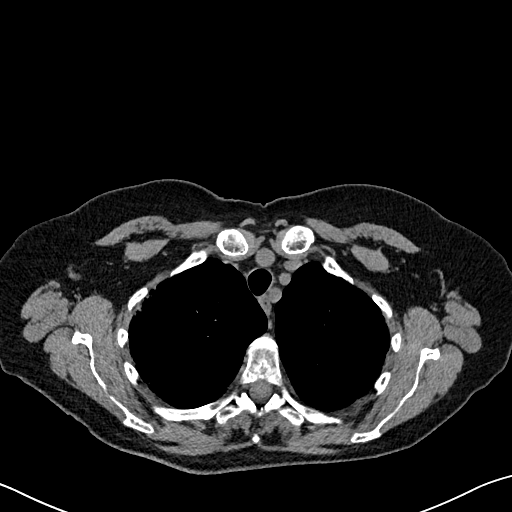
[im 114/148  lung]
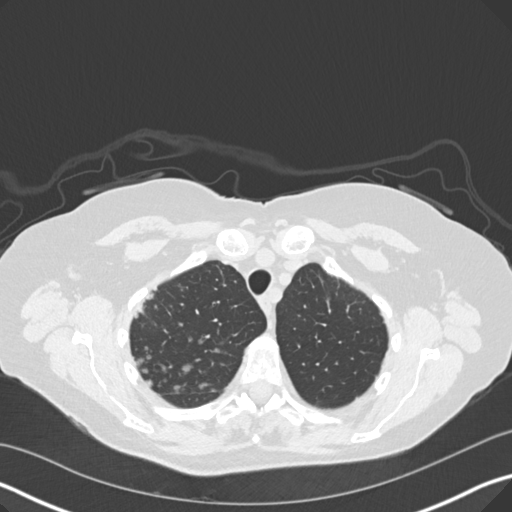
[im 125/148  lung]
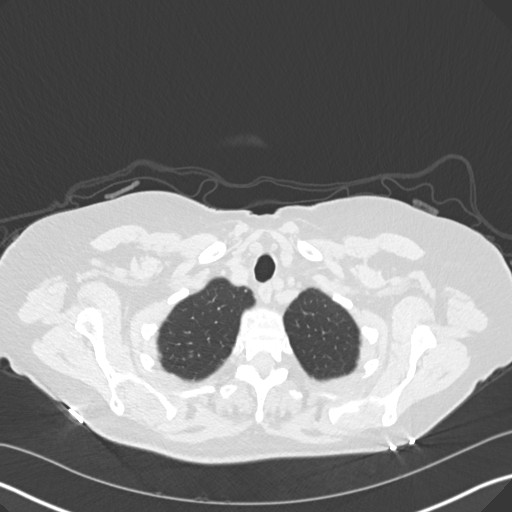
[im 136/148  lung]
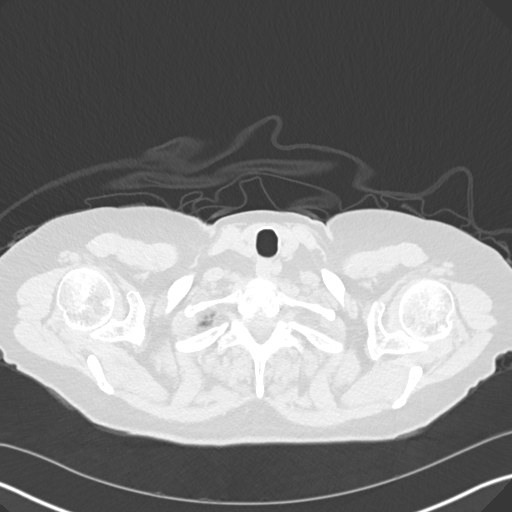

[Series 5: coronal · coronal · 0.59mm/px · 3 of 143 slices shown]
[im 29/143  lung]
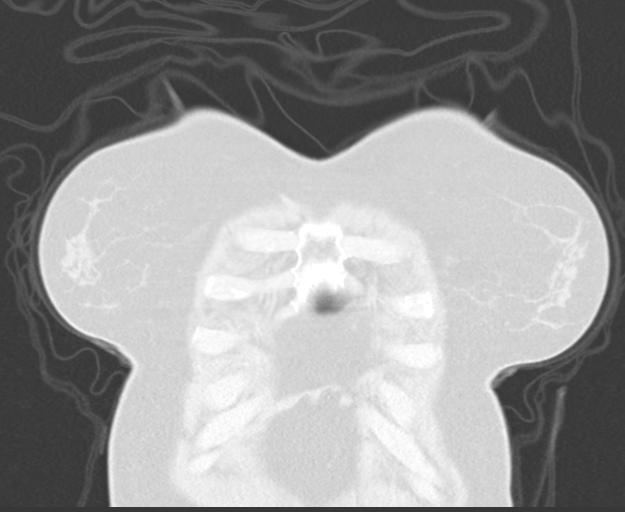
[im 57/143  lung]
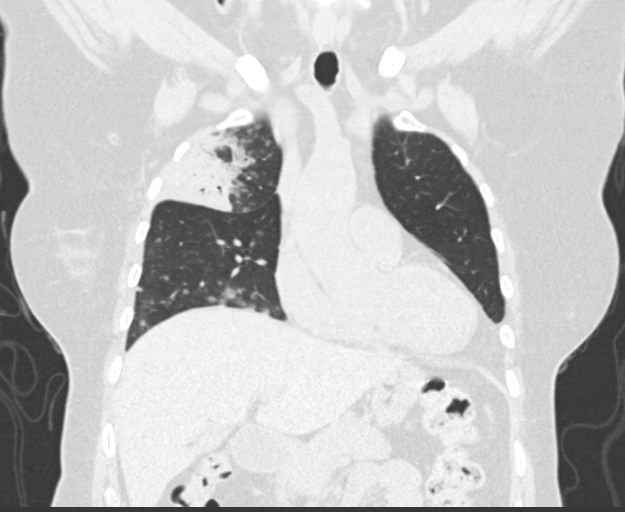
[im 86/143  lung]
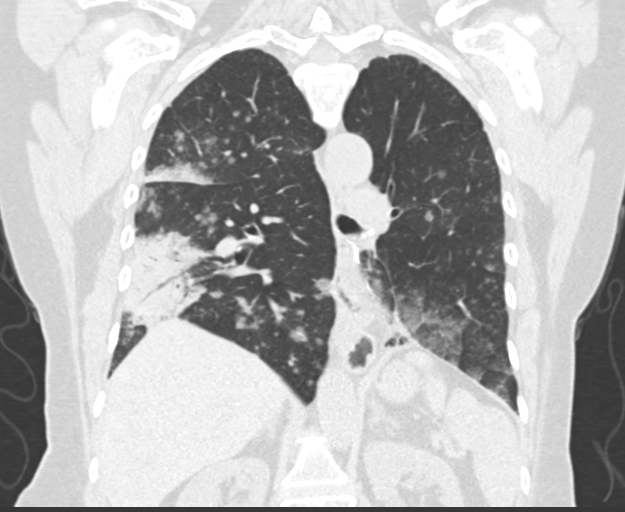

[14 of 36 positions shown; findings below may reference images not displayed]

FINDINGS: Cardiovascular: Normal heart size. No significant pericardial
effusion/thickening. Great vessels are normal in course and caliber.

Mediastinum/Nodes: No discrete thyroid nodules. Unremarkable
esophagus. No pathologically enlarged axillary, mediastinal or hilar
lymph nodes, noting limited sensitivity for the detection of hilar
adenopathy on this noncontrast study.

Lungs/Pleura: No pneumothorax. No pleural effusion. Status post left
lower lobectomy. There is widespread patchy consolidation and
nodularity throughout both lungs of variable solid, sub solid and
ground-glass density, increased in size, density, extent and number
in the interval. Representative masslike focus of mixed solid and
sub solid density in the medial basilar left upper lobe measuring
8.2 x 5.0 cm (series 7/image 95), significantly increased in density
and increased in size from 5.7 x 3.1 cm. Representative peripheral
basilar left upper lobe 1.0 cm sub solid pulmonary nodule (series
7/image 105), increased from 0.6 cm. Representative masslike 6.6 x
5.5 cm focus of consolidation with air bronchograms in the anterior
right lower lobe (series 7/image 83), previously 6.5 x 5.2 cm,
slightly increased in size and significantly increased in density.
Representative peripheral right upper lobe 10.6 x 6.0 cm masslike
focus of consolidation with air bronchograms (series 7/image 53),
previously 10.5 x 5.2 cm, mildly increased in size and significantly
increased in density. Representative 1.0 cm sub solid medial right
lower lobe pulmonary nodule (series 7/image 85), increased from
cm. Representative 1.2 cm sub solid right middle lobe pulmonary
nodule (series 7/image 95), increased [REDACTED] cm.

Upper abdomen: Small hiatal hernia.

Musculoskeletal: No aggressive appearing focal osseous lesions.
Moderate thoracic spondylosis.
IMPRESSION: 1. Interval progression of widespread bilateral pulmonary
adenocarcinoma, which has increased in size, extent and density as
detailed.
2. Small hiatal hernia.

## 2020-11-11 ENCOUNTER — Encounter: Payer: Self-pay | Admitting: Internal Medicine

## 2020-11-11 ENCOUNTER — Telehealth: Payer: Self-pay | Admitting: Internal Medicine

## 2020-11-11 NOTE — Telephone Encounter (Signed)
Cancelled appts per 4/19 sch msg. Pt's husband is aware.

## 2021-04-13 ENCOUNTER — Other Ambulatory Visit: Payer: Medicare Other

## 2021-04-16 ENCOUNTER — Ambulatory Visit: Payer: Medicare Other | Admitting: Internal Medicine

## 2021-06-25 DEATH — deceased

## 2021-08-01 IMAGING — CT CT CHEST W/O CM
2 of 3 series · 15 of 36 positions shown, 18 images · non-contrast
Comparison: Chest CT 07/12/2018

CLINICAL DATA: History of lung cancer.  Restaging exam.

EXAM:
CT CHEST WITHOUT CONTRAST
TECHNIQUE: Multidetector CT imaging of the chest was performed following the
standard protocol without IV contrast.

[Series 2: thorax · axial · 0.71mm/px · z∈[-357,-117]mm · 12 of 142 slices shown, 15 images]
[im 11/142  mediastinal]
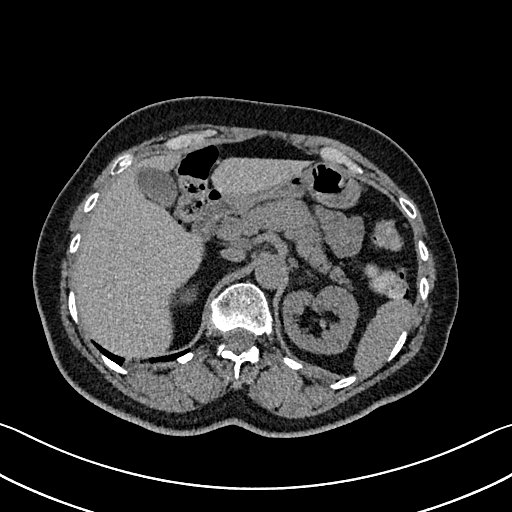
[im 11/142  lung]
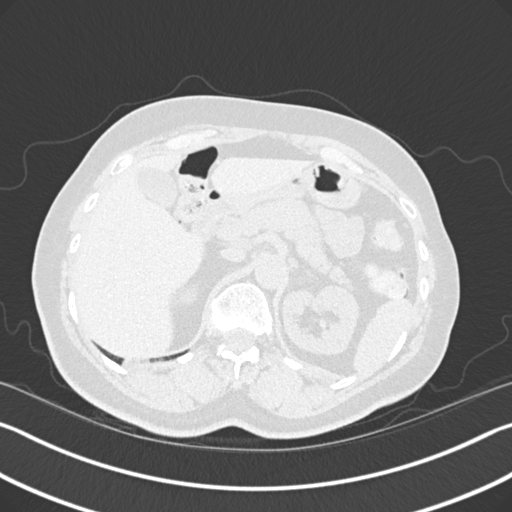
[im 21/142  lung]
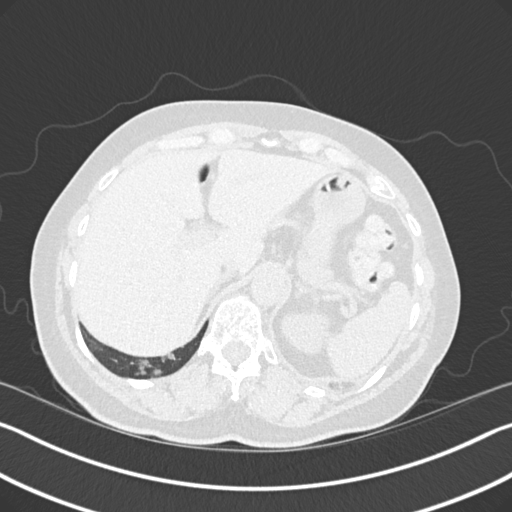
[im 32/142  lung]
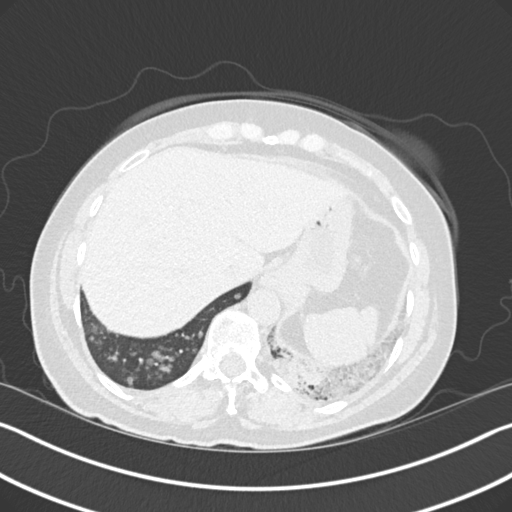
[im 42/142  lung]
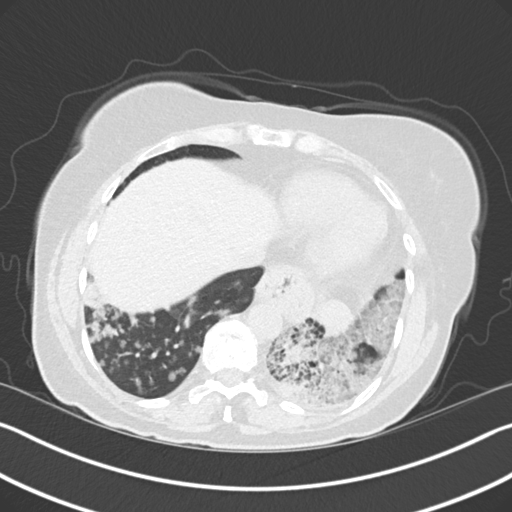
[im 53/142  mediastinal]
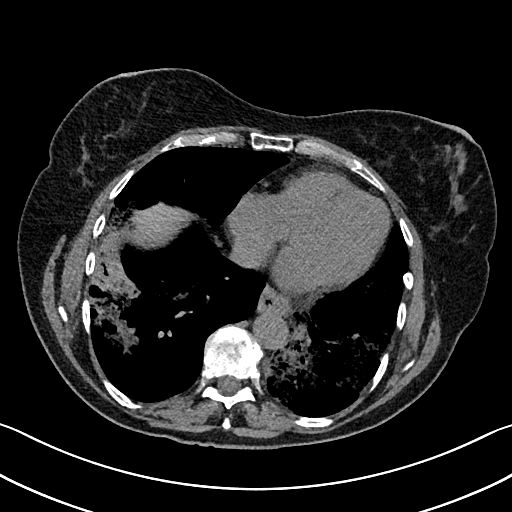
[im 53/142  lung]
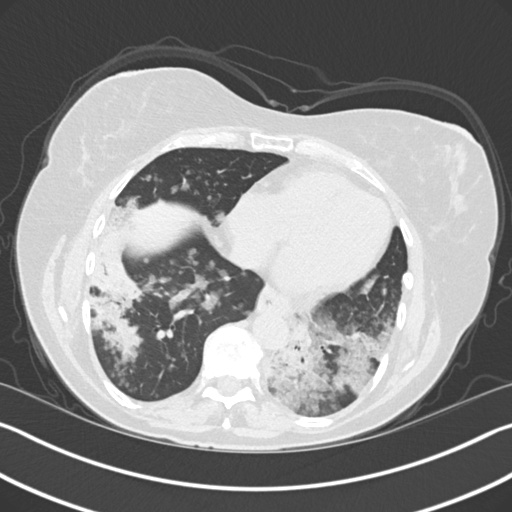
[im 63/142  lung]
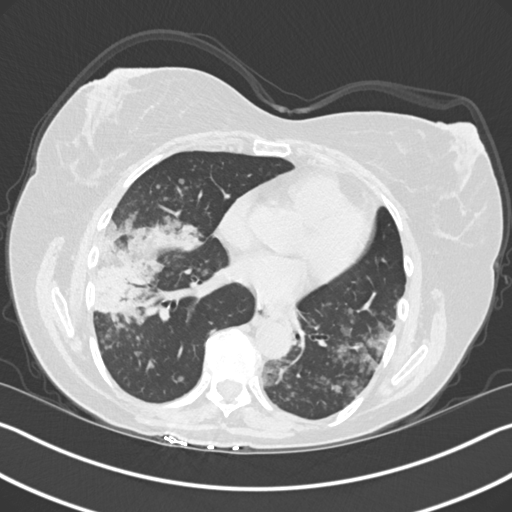
[im 79/142  lung]
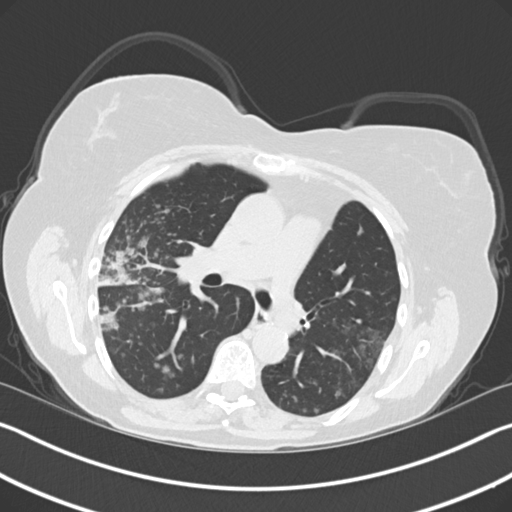
[im 89/142  lung]
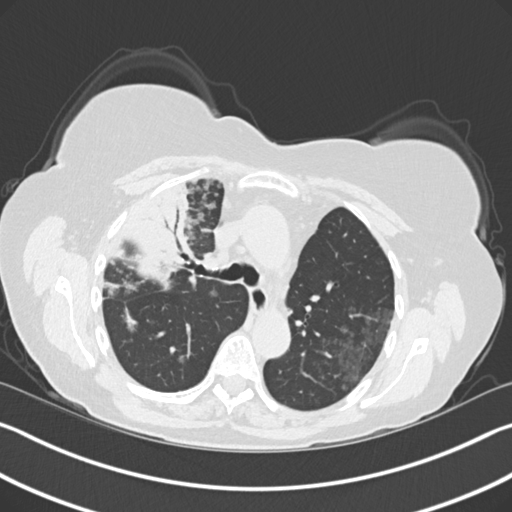
[im 100/142  mediastinal]
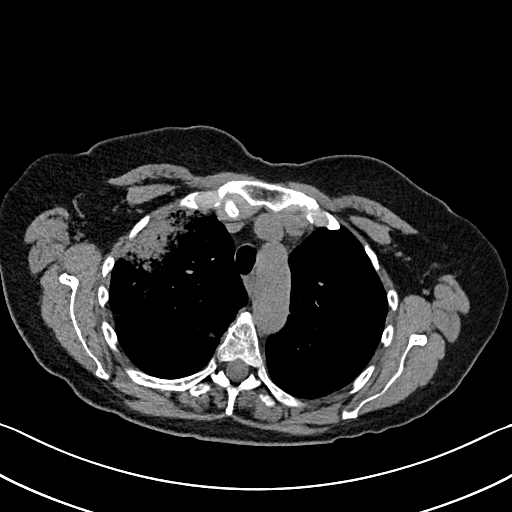
[im 100/142  lung]
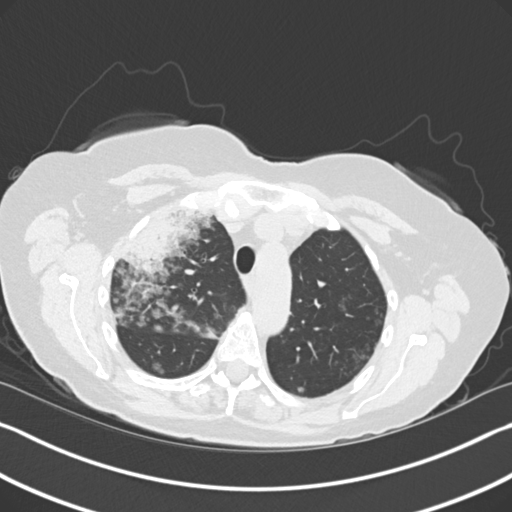
[im 110/142  lung]
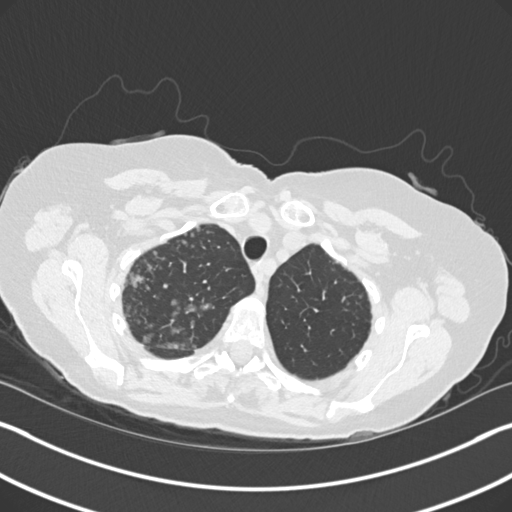
[im 121/142  lung]
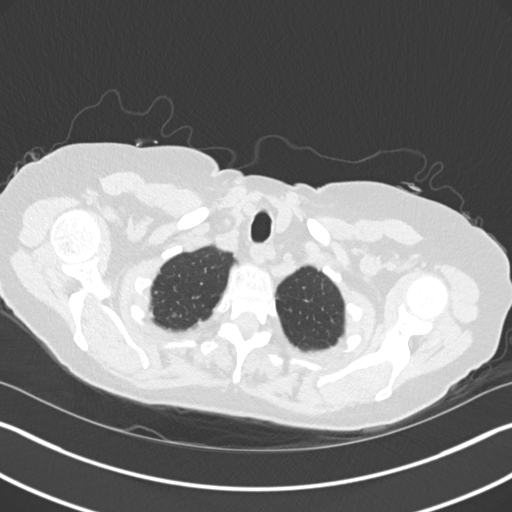
[im 131/142  lung]
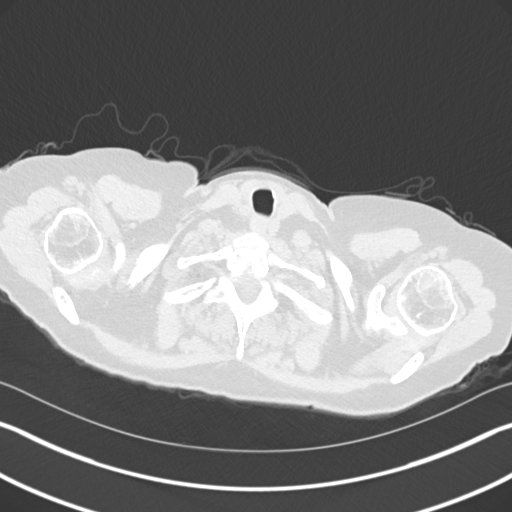

[Series 6: coronal · coronal · 0.60mm/px · 3 of 151 slices shown]
[im 31/151  lung]
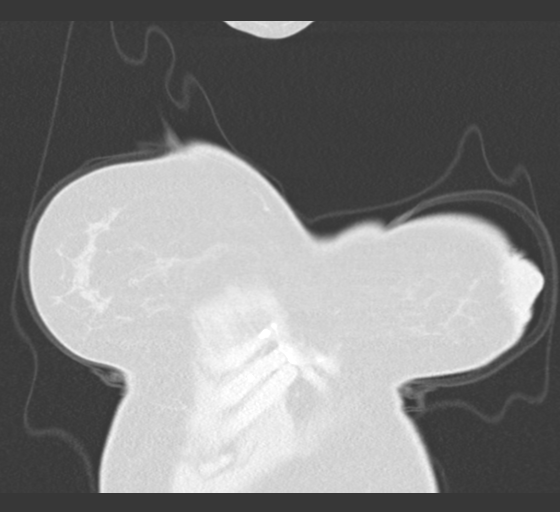
[im 61/151  lung]
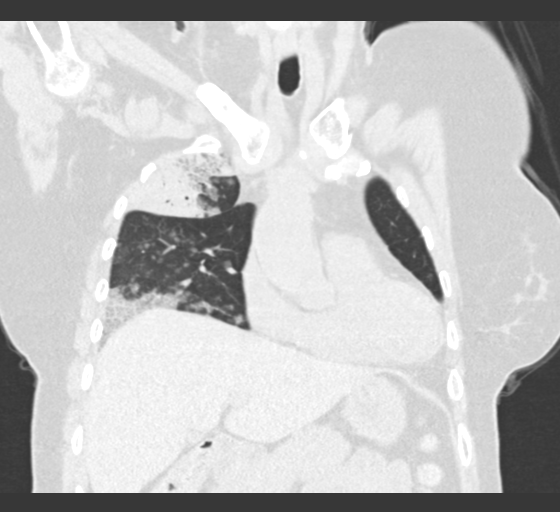
[im 91/151  lung]
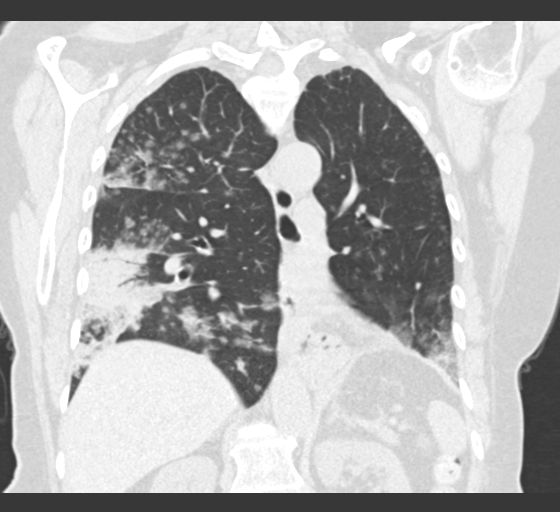

[15 of 36 positions shown; findings below may reference images not displayed]

FINDINGS: Cardiovascular: Normal heart size. Trace fluid superior pericardial
recess. Thoracic aortic vascular calcifications.

Mediastinum/Nodes: Moderate-sized hiatal hernia. Normal appearance
of the esophagus. Interval increase in size of right internal
mammary lymph node (image 58; series 2), measuring 1.0 cm,
previously 0.5 cm. No definite hilar or axillary adenopathy. Small
hiatal hernia.

Lungs/Pleura: Central airways are patent. Interval progression of
widespread patchy consolidative nodularity throughout the lungs
bilaterally. Representative masslike focus of mixed solid and sub
solid nodularity within the peripheral right lower lobe measures
x 8.1 cm (image 80; series 5), previously 6.5 x 5.5 cm.
Similar-appearing masslike area within the peripheral right upper
lobe measuring 10.1 x 5.8 cm (image 50; series 5), previously 10.6 x
5.9 cm. Postsurgical changes compatible with left lower lobectomy.
Within the inferior aspect of the residual left upper lobe there is
significant increased ground-glass opacity which now measures
approximately 11.8 x 6.0 cm (image 95; series 5), previously 8.2 x
5.0 cm. Representative medial right lower lobe nodule increased in
size measuring 1.4 cm (image 87; series 5), previously 1.0 cm.
Interval increase in size and number of multiple sub solid nodules
throughout the upper aspect of the residual left upper lobe. No
pleural effusion or pneumothorax.

Upper Abdomen: No acute process.

Musculoskeletal: Thoracic spine degenerative changes.
IMPRESSION: Interval progression of widespread bilateral pulmonary
adenocarcinoma.

## 2022-01-29 IMAGING — US US SOFT TISSUE HEAD/NECK
1 series · 14 of 14 positions shown · non-contrast
Comparison: None.

CLINICAL DATA: Intermittent swelling involving the right
submandibular aspect of the neck for the past year. History of lung
cancer.

EXAM:
ULTRASOUND OF HEAD/NECK SOFT TISSUES
TECHNIQUE: Ultrasound examination of the head and neck soft tissues was
performed in the area of clinical concern.

[Series 1: us soft tissue head/neck · 0.05mm/px · 14 of 14 slices shown]
[im 1/14]
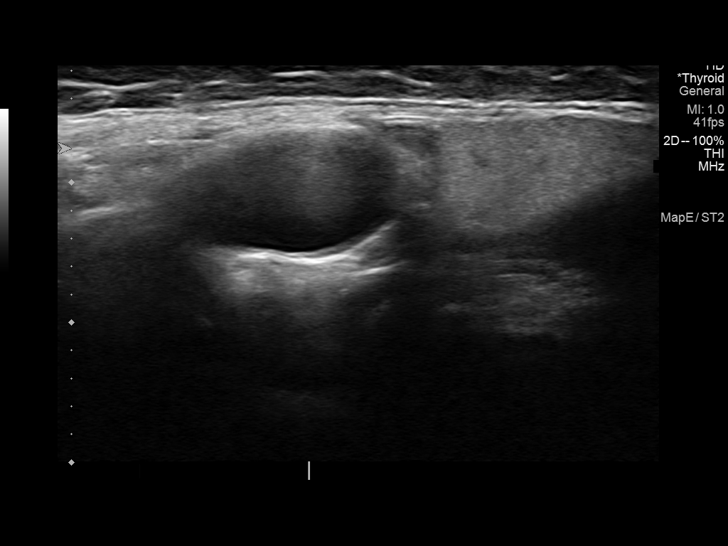
[im 2/14]
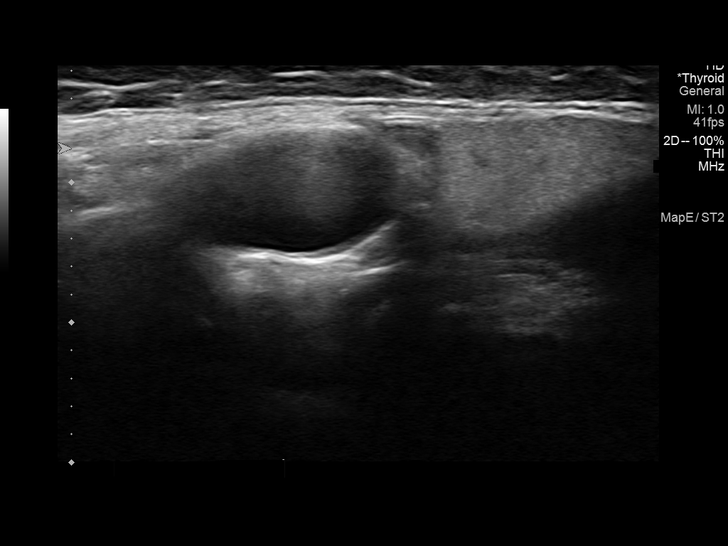
[im 3/14]
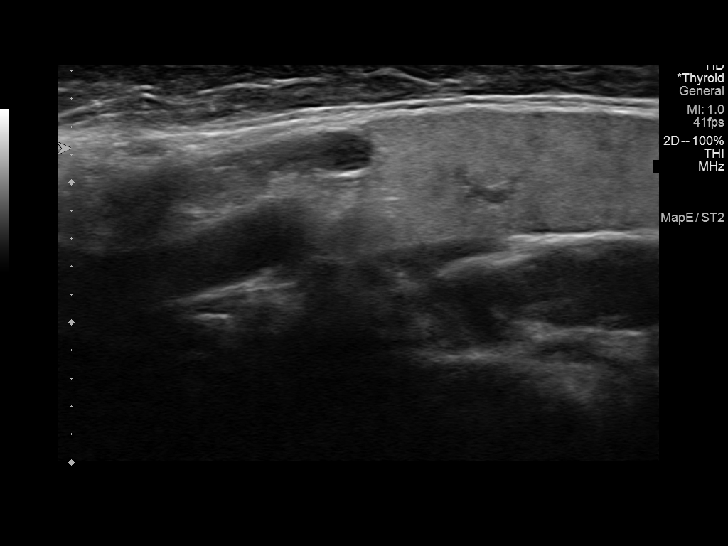
[im 4/14]
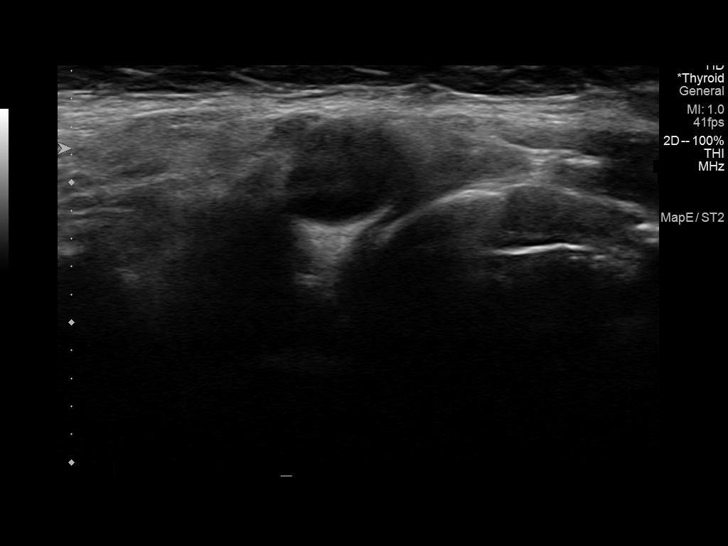
[im 5/14]
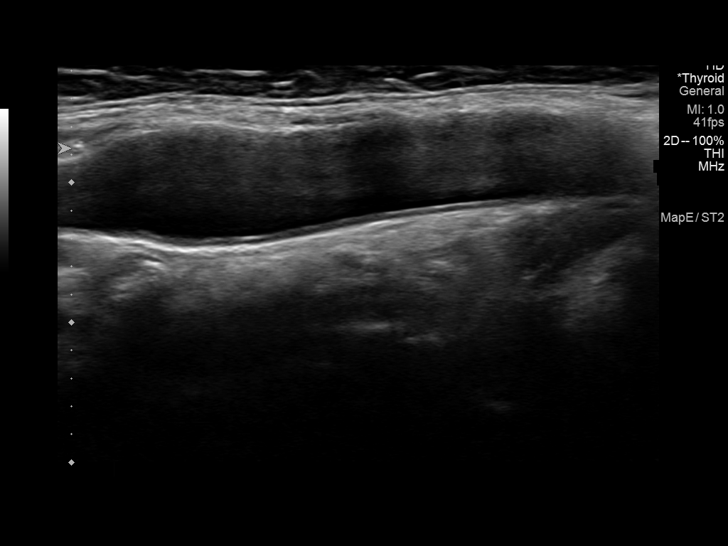
[im 6/14]
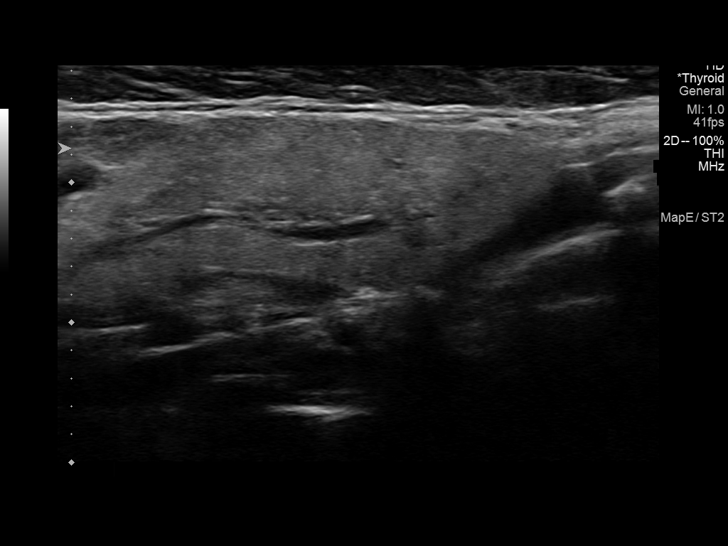
[im 7/14]
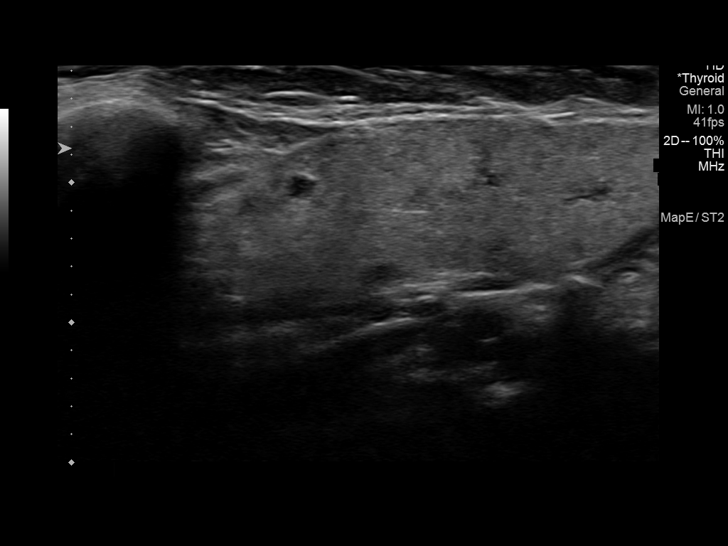
[im 8/14]
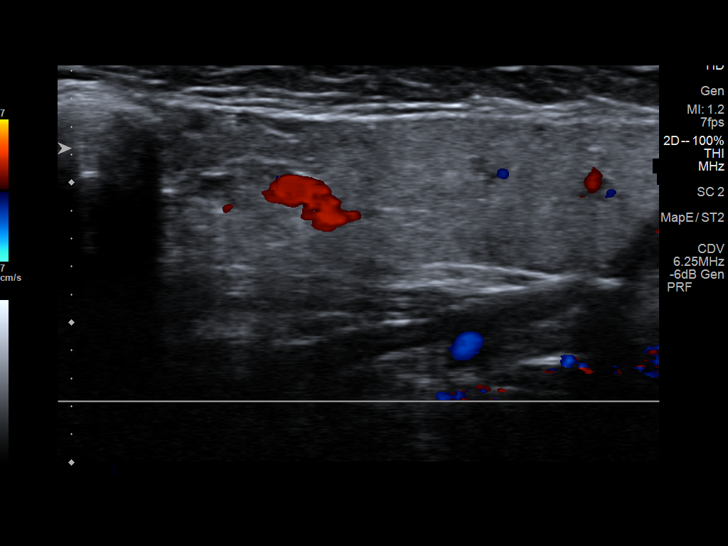
[im 9/14]
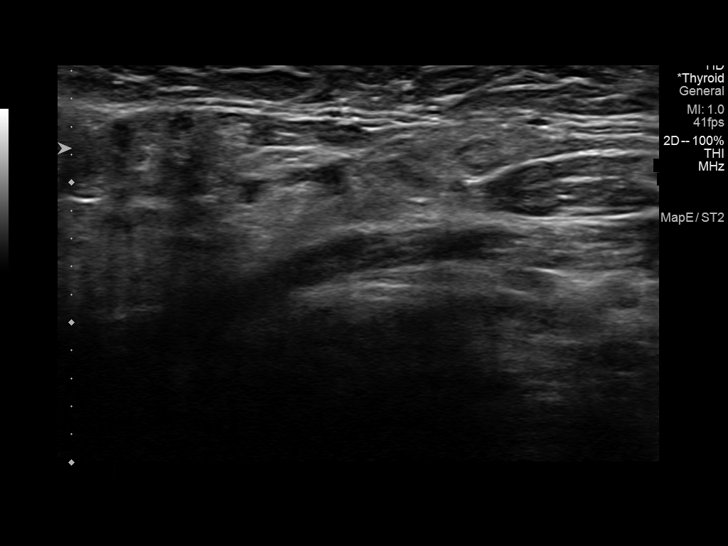
[im 10/14]
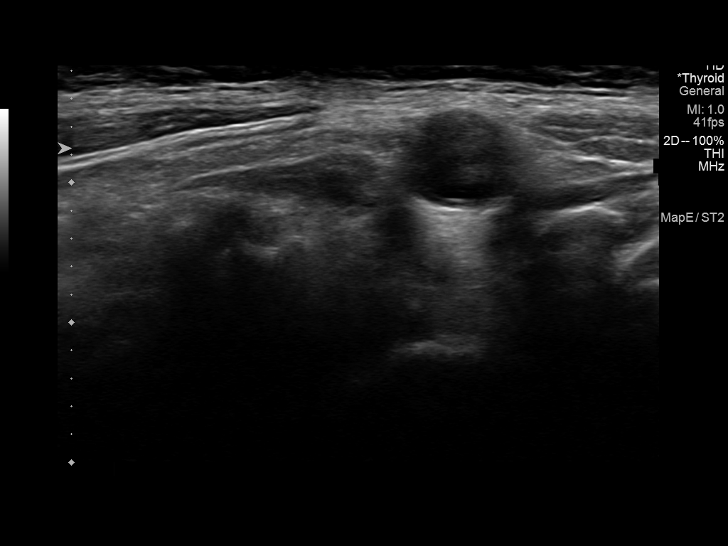
[im 11/14]
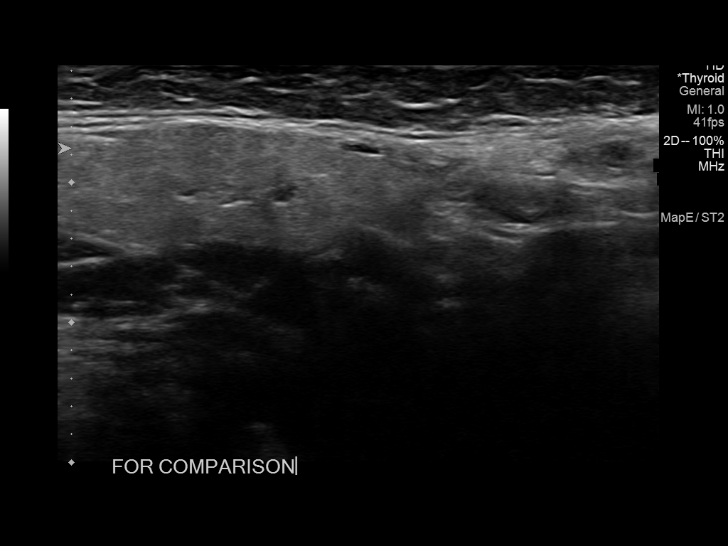
[im 12/14]
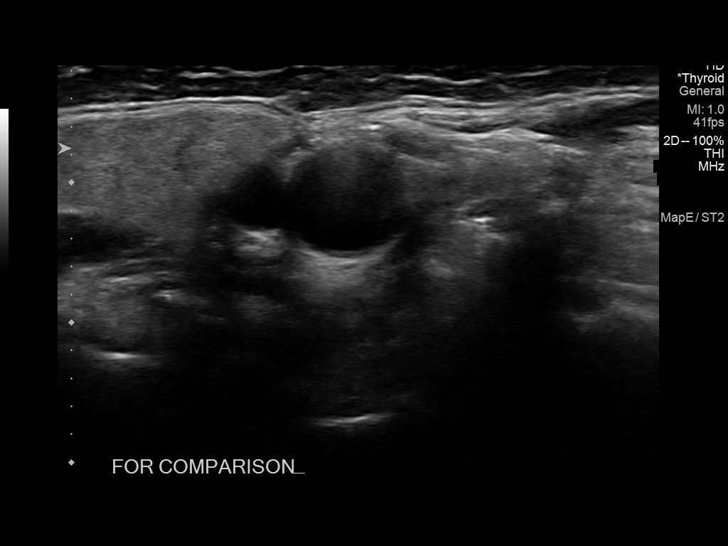
[im 13/14]
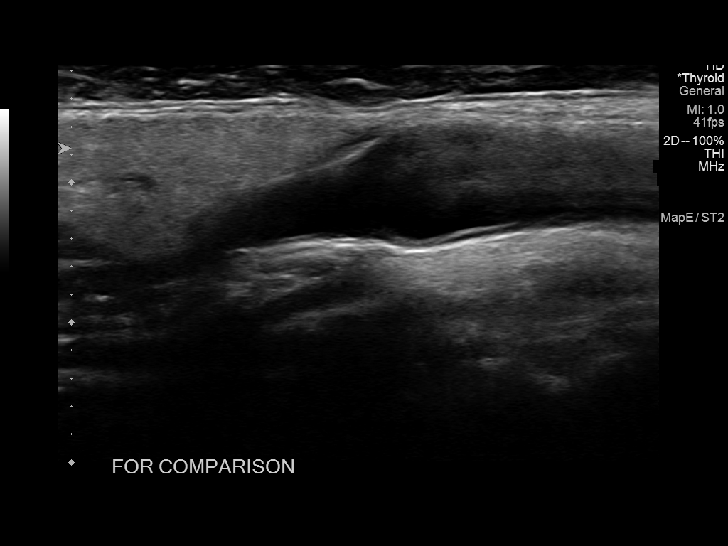
[im 14/14]
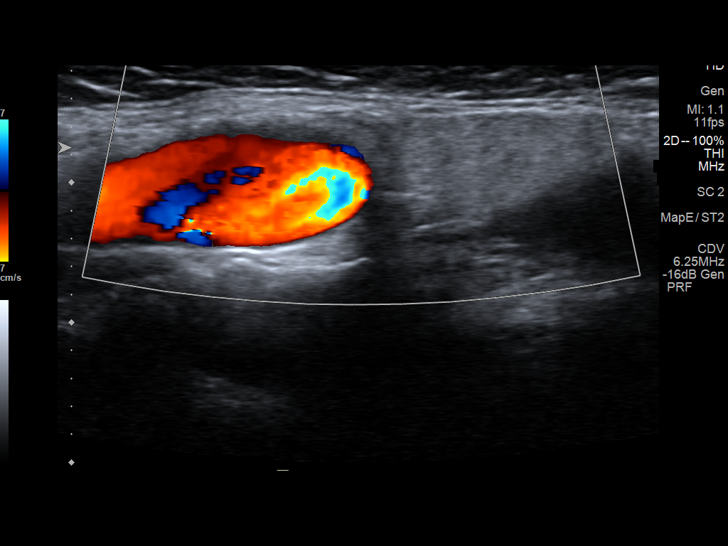

[14 of 14 positions shown; findings below may reference images not displayed]

FINDINGS: There is mild heterogeneity of the right submandibular gland without
discrete nodule or mass. Questionable mild submandibular ductal
dilatation measuring approximately 1.3 cm in diameter without
definitive evidence of a ductal stone.

Otherwise, there is no sonographic correlate for patient's palpable
area of concern. Specifically, no discrete solid or cystic lesions.
No regional cervical lymphadenopathy. Adjacent vasculature appears
patent.
IMPRESSION: 1. Mild heterogeneity of the right submandibular gland with
questionable ductal dilatation, but without definitive
intraglandular nodule/mass or ductal stone, nonspecific though could
represent the sequela of a previous episode of sialadenitis.
Clinical correlation to resolution is advised.
2. Otherwise, no sonographic correlate for patient's palpable area
of concern.

## 2022-08-04 IMAGING — CT CT CHEST W/O CM
2 of 4 series · 15 of 36 positions shown, 18 images · non-contrast
Comparison: Most recent CT chest 04/13/2019.

CLINICAL DATA: Primary Cancer Type: Lung
TECHNIQUE: Multidetector CT imaging of the chest was performed following the
standard protocol without IV contrast.

[Series 2: thorax · axial · 0.66mm/px · z∈[+1367,+1599]mm · 12 of 138 slices shown, 15 images]
[im 11/138  mediastinal]
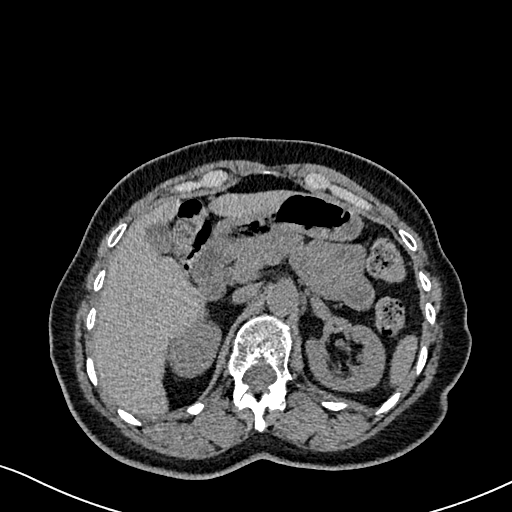
[im 11/138  lung]
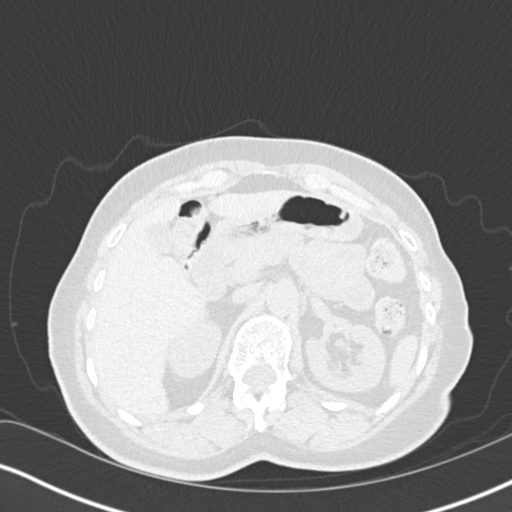
[im 22/138  lung]
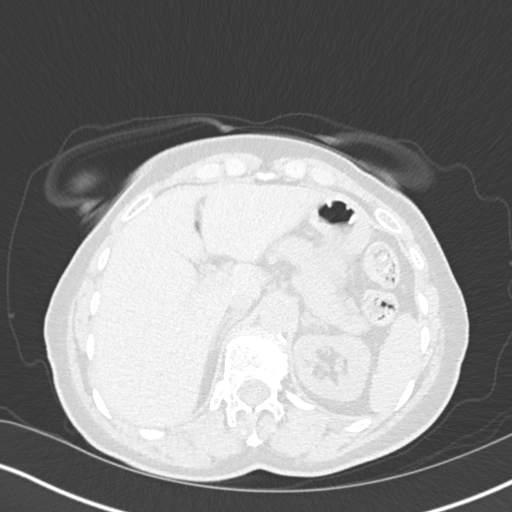
[im 32/138  lung]
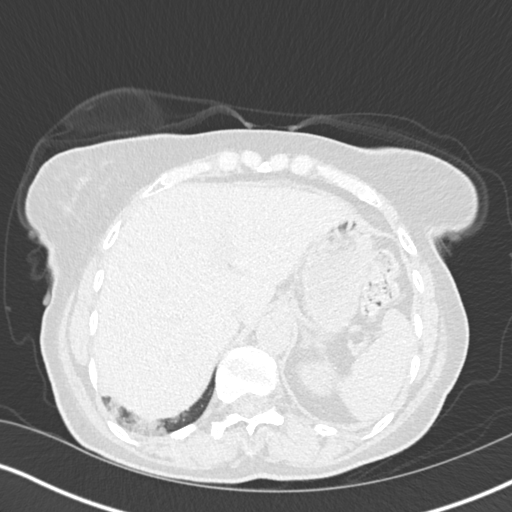
[im 43/138  lung]
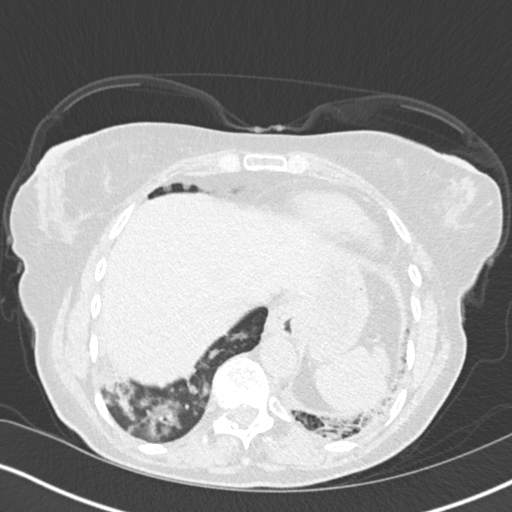
[im 53/138  mediastinal]
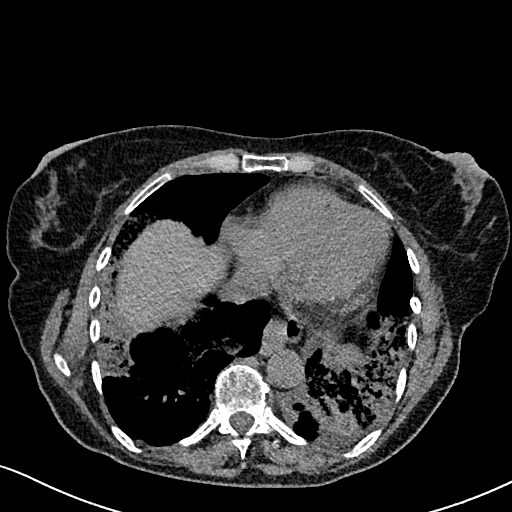
[im 53/138  lung]
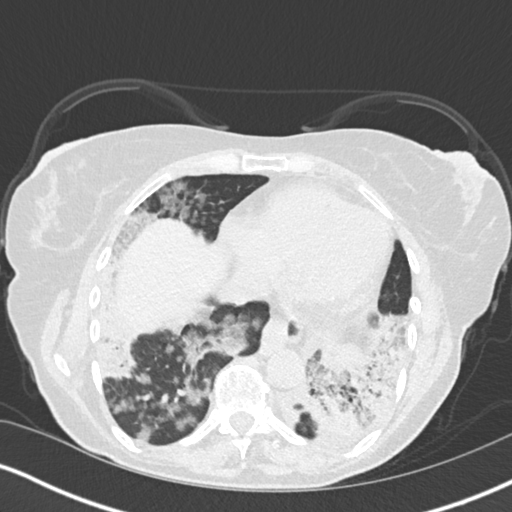
[im 64/138  lung]
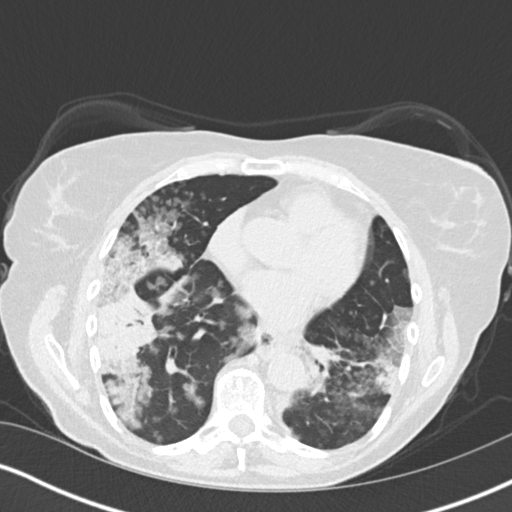
[im 74/138  lung]
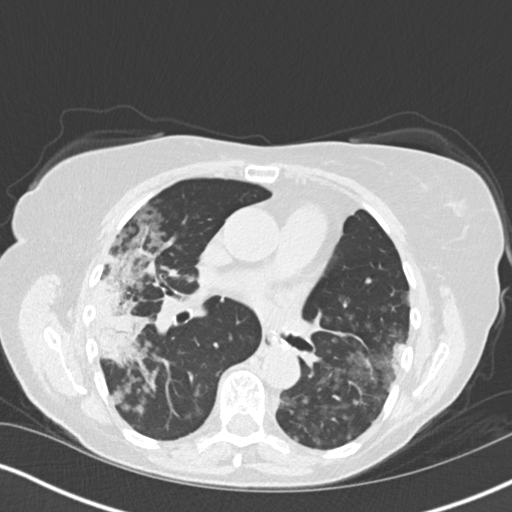
[im 85/138  lung]
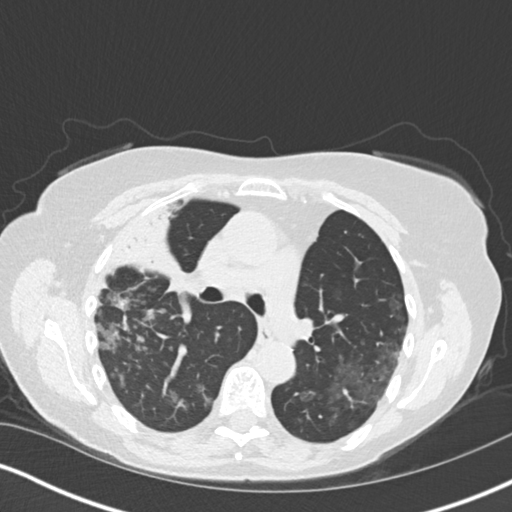
[im 95/138  mediastinal]
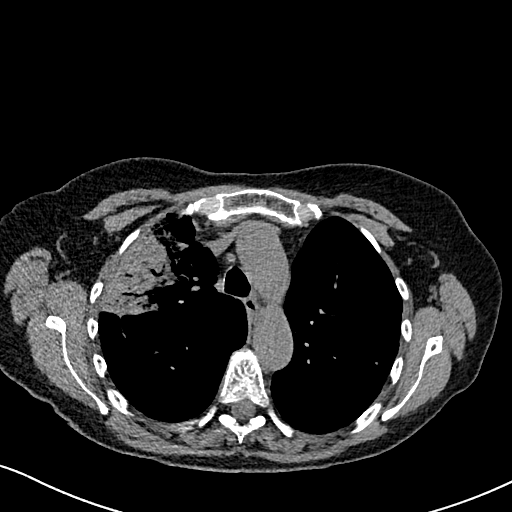
[im 95/138  lung]
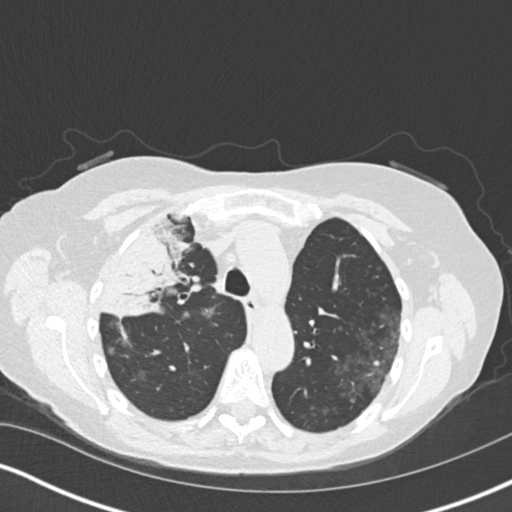
[im 106/138  lung]
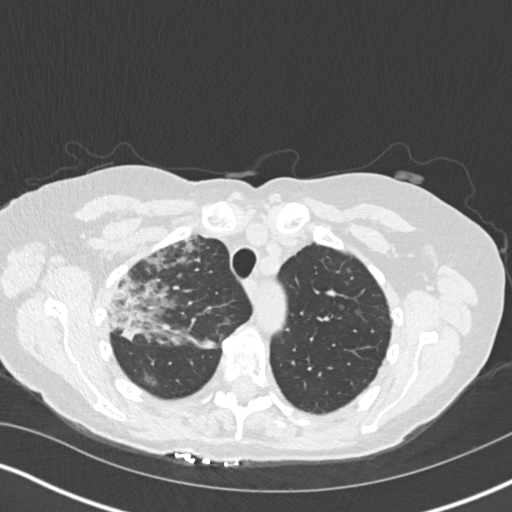
[im 116/138  lung]
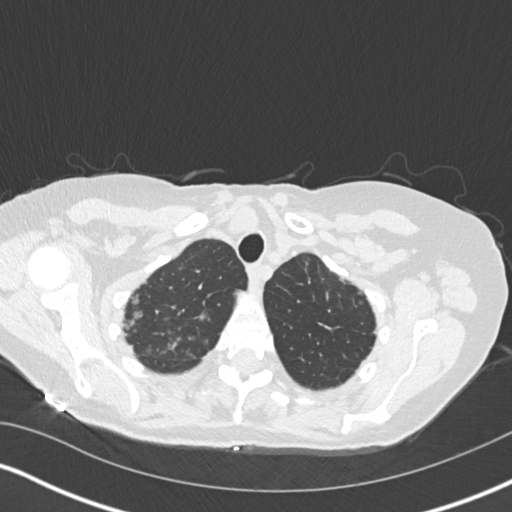
[im 127/138  lung]
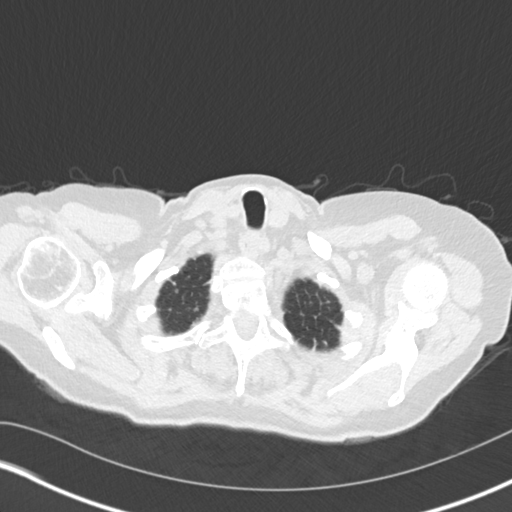

[Series 5: coronal · coronal · 0.59mm/px · 3 of 108 slices shown]
[im 22/108  lung]
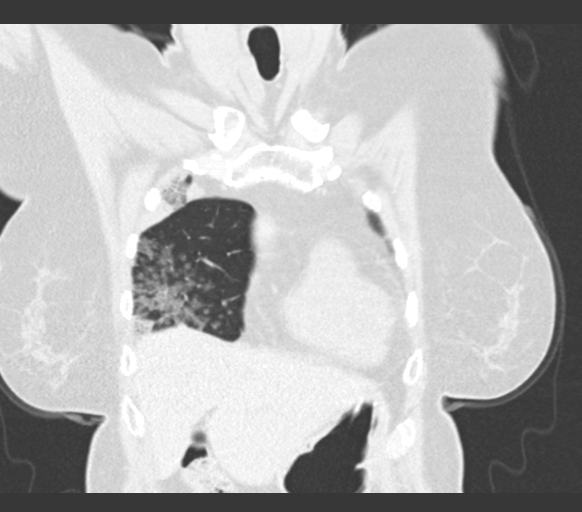
[im 43/108  lung]
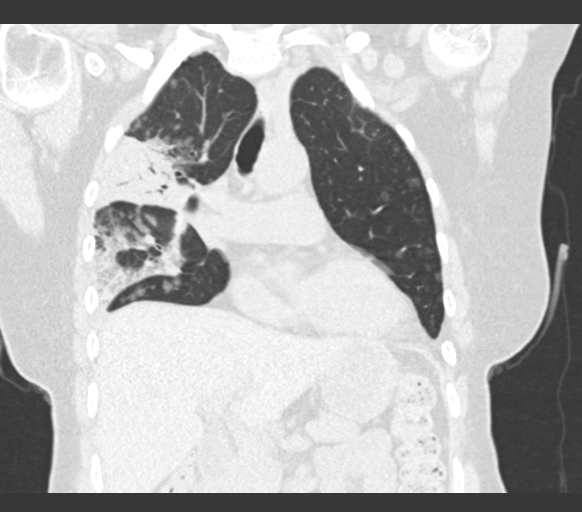
[im 65/108  lung]
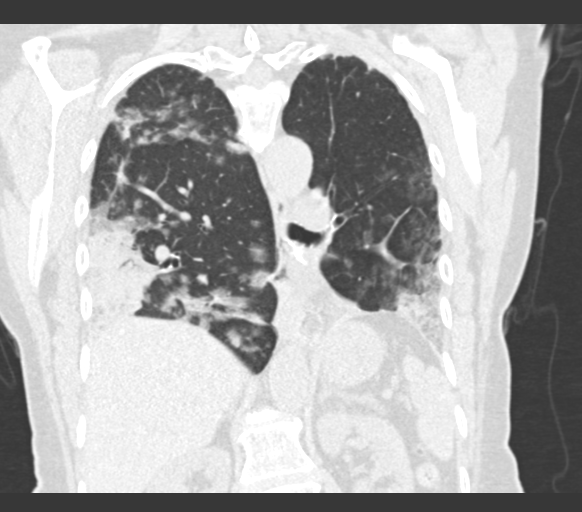

[15 of 36 positions shown; findings below may reference images not displayed]

Imaging Indication: Active Surveillance

Interval therapy since last imaging? No

Initial Cancer Diagnosis

Date: [DATE]; Established by: Biopsy-proven

Detailed Pathology: Stage IIb non-small cell lung cancer,
adenocarcinoma.

Primary Tumor location: Left lower lobe.

Recurrence? Yes; Date(s) of recurrence: 5995; Established by:
Imaging only

Surgeries: Left lower lobectomy 0690 at [facility].

Chemotherapy: Yes; Ongoing?  No; Most recent administration: 0690

Immunotherapy? No

Radiation therapy? No

EXAM:
CT CHEST WITHOUT CONTRAST
FINDINGS: Cardiovascular: Normal heart size. No significant pericardial
effusion/thickening. Great vessels are normal in course and caliber.

Mediastinum/Nodes: Thyroid is either surgically absent or atrophic.
Unremarkable esophagus. No pathologically enlarged axillary,
mediastinal or hilar lymph nodes, noting limited sensitivity for the
detection of hilar adenopathy on this noncontrast study.

Lungs/Pleura: No pneumothorax. No pleural effusion. Status post left
lower lobectomy. Extensive patchy regions of dense consolidation
with air bronchograms throughout both lungs with surrounding patchy
and nodular regions of ground-glass opacity throughout both lungs.
Findings overall appear qualitatively mildly progressive since
04/13/2019 CT, although findings are difficult to measure
quantitatively. Representative 0.7 cm apical right upper lobe nodule
(series 7/image 17), increased from 0.2 cm. Confluent region of
patchy consolidation and nodular ground-glass opacity spanning the
right middle and right lower lobes measures 16.9 x 5.6 cm (series
7/image 75), increased from 15.0 x 4.9 cm using similar measurement
technique. Large indistinct region of patchy consolidation and
ground-glass opacity in the basilar left upper lobe has increased in
density in the interval (series 7/image 80). There is increased
volume loss in the lower lungs bilaterally.

Upper abdomen: Small hiatal hernia.

Musculoskeletal: No aggressive appearing focal osseous lesions.
Moderate thoracic spondylosis.
IMPRESSION: 1. Extensive involvement of both lungs by adenocarcinoma, appearing
mildly progressive in the interval as detailed.
2. Increased volume loss in the lower lungs bilaterally.
3. Status post left lower lobectomy.
4. No thoracic adenopathy.
5. Small hiatal hernia.
